# Patient Record
Sex: Female | Born: 1945 | Race: White | Hispanic: No | Marital: Single | Smoking: Never smoker
Health system: Southern US, Community
[De-identification: ages and names within clinical notes are randomized; demographics above are authoritative.]

## PROBLEM LIST (undated history)

## (undated) DIAGNOSIS — E079 Disorder of thyroid, unspecified: Secondary | ICD-10-CM

## (undated) DIAGNOSIS — M549 Dorsalgia, unspecified: Secondary | ICD-10-CM

## (undated) DIAGNOSIS — M419 Scoliosis, unspecified: Secondary | ICD-10-CM

## (undated) DIAGNOSIS — Z9109 Other allergy status, other than to drugs and biological substances: Secondary | ICD-10-CM

## (undated) DIAGNOSIS — J40 Bronchitis, not specified as acute or chronic: Secondary | ICD-10-CM

## (undated) DIAGNOSIS — K219 Gastro-esophageal reflux disease without esophagitis: Secondary | ICD-10-CM

## (undated) DIAGNOSIS — F329 Major depressive disorder, single episode, unspecified: Secondary | ICD-10-CM

## (undated) DIAGNOSIS — F32A Depression, unspecified: Secondary | ICD-10-CM

## (undated) HISTORY — DX: Bronchitis, not specified as acute or chronic: J40

## (undated) HISTORY — PX: KNEE SURGERY: SHX244

## (undated) HISTORY — DX: Depression, unspecified: F32.A

## (undated) HISTORY — DX: Scoliosis, unspecified: M41.9

## (undated) HISTORY — DX: Disorder of thyroid, unspecified: E07.9

## (undated) HISTORY — DX: Dorsalgia, unspecified: M54.9

## (undated) HISTORY — DX: Gastro-esophageal reflux disease without esophagitis: K21.9

## (undated) HISTORY — DX: Major depressive disorder, single episode, unspecified: F32.9

## (undated) HISTORY — PX: CATARACT EXTRACTION, BILATERAL: SHX1313

## (undated) HISTORY — DX: Other allergy status, other than to drugs and biological substances: Z91.09

---

## 1970-10-19 HISTORY — PX: APPENDECTOMY: SHX54

## 1996-10-19 HISTORY — PX: TONSILLECTOMY: SUR1361

## 2004-09-01 ENCOUNTER — Ambulatory Visit: Payer: Self-pay | Admitting: Gastroenterology

## 2005-12-29 ENCOUNTER — Ambulatory Visit: Payer: Self-pay | Admitting: Gastroenterology

## 2010-10-08 ENCOUNTER — Emergency Department (HOSPITAL_BASED_OUTPATIENT_CLINIC_OR_DEPARTMENT_OTHER)
Admission: EM | Admit: 2010-10-08 | Discharge: 2010-10-08 | Payer: Self-pay | Source: Home / Self Care | Admitting: Emergency Medicine

## 2010-10-21 ENCOUNTER — Ambulatory Visit
Admission: RE | Admit: 2010-10-21 | Discharge: 2010-10-21 | Payer: Self-pay | Source: Home / Self Care | Attending: Family Medicine | Admitting: Family Medicine

## 2010-10-21 DIAGNOSIS — E039 Hypothyroidism, unspecified: Secondary | ICD-10-CM | POA: Insufficient documentation

## 2010-10-21 DIAGNOSIS — M79606 Pain in leg, unspecified: Secondary | ICD-10-CM | POA: Insufficient documentation

## 2010-10-21 DIAGNOSIS — M545 Low back pain, unspecified: Secondary | ICD-10-CM | POA: Insufficient documentation

## 2010-10-21 DIAGNOSIS — M25519 Pain in unspecified shoulder: Secondary | ICD-10-CM | POA: Insufficient documentation

## 2010-10-21 DIAGNOSIS — M25569 Pain in unspecified knee: Secondary | ICD-10-CM | POA: Insufficient documentation

## 2010-10-22 ENCOUNTER — Ambulatory Visit: Admit: 2010-10-22 | Payer: Self-pay | Admitting: Family Medicine

## 2010-10-28 ENCOUNTER — Encounter
Admission: RE | Admit: 2010-10-28 | Discharge: 2010-11-17 | Payer: Self-pay | Source: Home / Self Care | Attending: Family Medicine | Admitting: Family Medicine

## 2010-11-14 ENCOUNTER — Ambulatory Visit
Admission: RE | Admit: 2010-11-14 | Discharge: 2010-11-14 | Payer: Self-pay | Source: Home / Self Care | Attending: Family Medicine | Admitting: Family Medicine

## 2010-11-14 ENCOUNTER — Encounter: Payer: Self-pay | Admitting: Family Medicine

## 2010-11-20 NOTE — Op Note (Signed)
Summary: Office Procedure Consent form  Office Procedure Consent form   Imported By: Darius Bump 11/14/2010 14:17:47  _____________________________________________________________________  External Attachment:    Type:   Image     Comment:   External Document
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
## 2010-12-11 ENCOUNTER — Ambulatory Visit: Payer: Worker's Compensation | Attending: Family Medicine | Admitting: Physical Therapy

## 2010-12-11 DIAGNOSIS — M25619 Stiffness of unspecified shoulder, not elsewhere classified: Secondary | ICD-10-CM | POA: Insufficient documentation

## 2010-12-11 DIAGNOSIS — IMO0001 Reserved for inherently not codable concepts without codable children: Secondary | ICD-10-CM | POA: Insufficient documentation

## 2010-12-11 DIAGNOSIS — M25519 Pain in unspecified shoulder: Secondary | ICD-10-CM | POA: Insufficient documentation

## 2010-12-17 ENCOUNTER — Ambulatory Visit: Payer: Worker's Compensation

## 2010-12-18 ENCOUNTER — Ambulatory Visit: Payer: Worker's Compensation | Attending: Family Medicine | Admitting: Rehabilitation

## 2010-12-18 DIAGNOSIS — M25519 Pain in unspecified shoulder: Secondary | ICD-10-CM | POA: Insufficient documentation

## 2010-12-18 DIAGNOSIS — M25619 Stiffness of unspecified shoulder, not elsewhere classified: Secondary | ICD-10-CM | POA: Insufficient documentation

## 2010-12-18 DIAGNOSIS — IMO0001 Reserved for inherently not codable concepts without codable children: Secondary | ICD-10-CM | POA: Insufficient documentation

## 2010-12-20 ENCOUNTER — Encounter: Payer: Self-pay | Admitting: *Deleted

## 2010-12-23 ENCOUNTER — Encounter: Payer: Worker's Compensation | Admitting: Rehabilitation

## 2010-12-25 ENCOUNTER — Ambulatory Visit: Payer: Worker's Compensation | Admitting: Rehabilitation

## 2010-12-29 ENCOUNTER — Ambulatory Visit (INDEPENDENT_AMBULATORY_CARE_PROVIDER_SITE_OTHER): Payer: Worker's Compensation | Admitting: Family Medicine

## 2010-12-29 ENCOUNTER — Encounter: Payer: Self-pay | Admitting: Family Medicine

## 2010-12-29 DIAGNOSIS — M25519 Pain in unspecified shoulder: Secondary | ICD-10-CM
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
## 2011-01-07 ENCOUNTER — Ambulatory Visit: Payer: Worker's Compensation | Admitting: Physical Therapy

## 2011-01-12 ENCOUNTER — Ambulatory Visit: Payer: Worker's Compensation

## 2012-05-19 LAB — HM MAMMOGRAPHY

## 2012-07-26 ENCOUNTER — Encounter: Payer: Self-pay | Admitting: Family Medicine

## 2012-07-26 ENCOUNTER — Ambulatory Visit (INDEPENDENT_AMBULATORY_CARE_PROVIDER_SITE_OTHER): Payer: Worker's Compensation | Admitting: Family Medicine

## 2012-07-26 VITALS — BP 122/80 | HR 66 | Temp 97.7°F | Ht 67.0 in | Wt 188.0 lb

## 2012-07-26 DIAGNOSIS — E039 Hypothyroidism, unspecified: Secondary | ICD-10-CM

## 2012-07-26 DIAGNOSIS — Z23 Encounter for immunization: Secondary | ICD-10-CM

## 2012-07-26 DIAGNOSIS — Z0000 Encounter for general adult medical examination without abnormal findings: Secondary | ICD-10-CM

## 2012-07-26 DIAGNOSIS — R5381 Other malaise: Secondary | ICD-10-CM

## 2012-07-26 DIAGNOSIS — R5383 Other fatigue: Secondary | ICD-10-CM

## 2012-07-26 DIAGNOSIS — E669 Obesity, unspecified: Secondary | ICD-10-CM

## 2012-07-26 DIAGNOSIS — N390 Urinary tract infection, site not specified: Secondary | ICD-10-CM

## 2012-07-26 LAB — HEPATIC FUNCTION PANEL
ALT: 15 U/L (ref 0–35)
AST: 22 U/L (ref 0–37)
Bilirubin, Direct: 0.1 mg/dL (ref 0.0–0.3)
Total Bilirubin: 0.9 mg/dL (ref 0.3–1.2)
Total Protein: 7.3 g/dL (ref 6.0–8.3)

## 2012-07-26 LAB — CBC WITH DIFFERENTIAL/PLATELET
Basophils Absolute: 0 10*3/uL (ref 0.0–0.1)
Basophils Relative: 0.2 % (ref 0.0–3.0)
Eosinophils Absolute: 0 10*3/uL (ref 0.0–0.7)
HCT: 39.3 % (ref 36.0–46.0)
Hemoglobin: 13.1 g/dL (ref 12.0–15.0)
Lymphocytes Relative: 28.4 % (ref 12.0–46.0)
Lymphs Abs: 1.5 10*3/uL (ref 0.7–4.0)
MCHC: 33.3 g/dL (ref 30.0–36.0)
MCV: 95 fl (ref 78.0–100.0)
Neutro Abs: 3.4 10*3/uL (ref 1.4–7.7)
RBC: 4.14 Mil/uL (ref 3.87–5.11)
RDW: 12.1 % (ref 11.5–14.6)

## 2012-07-26 LAB — BASIC METABOLIC PANEL
BUN: 19 mg/dL (ref 6–23)
CO2: 28 mEq/L (ref 19–32)
Calcium: 9.3 mg/dL (ref 8.4–10.5)
Chloride: 101 mEq/L (ref 96–112)
Creatinine, Ser: 0.6 mg/dL (ref 0.4–1.2)
Glucose, Bld: 72 mg/dL (ref 70–99)

## 2012-07-26 LAB — T3, FREE: T3, Free: 2.5 pg/mL (ref 2.3–4.2)

## 2012-07-26 MED ORDER — VENLAFAXINE HCL ER 75 MG PO CP24: ORAL_CAPSULE | ORAL | Status: DC

## 2012-07-26 NOTE — Patient Instructions (Addendum)
Hypothyroidism The thyroid is a large gland located in the lower front of your neck. The thyroid gland helps control metabolism. Metabolism is how your body handles food. It controls metabolism with the hormone thyroxine. When this gland is underactive (hypothyroid), it produces too little hormone.  CAUSES These include:   Absence or destruction of thyroid tissue.  Goiter due to iodine deficiency.  Goiter due to medications.  Congenital defects (since birth).  Problems with the pituitary. This causes a lack of TSH (thyroid stimulating hormone). This hormone tells the thyroid to turn out more hormone. SYMPTOMS  Lethargy (feeling as though you have no energy)  Cold intolerance  Weight gain (in spite of normal food intake)  Dry skin  Coarse hair  Menstrual irregularity (if severe, may lead to infertility)  Slowing of thought processes Cardiac problems are also caused by insufficient amounts of thyroid hormone. Hypothyroidism in the newborn is cretinism, and is an extreme form. It is important that this form be treated adequately and immediately or it will lead rapidly to retarded physical and mental development. DIAGNOSIS  To prove hypothyroidism, your caregiver may do blood tests and ultrasound tests. Sometimes the signs are hidden. It may be necessary for your caregiver to watch this illness with blood tests either before or after diagnosis and treatment. TREATMENT  Low levels of thyroid hormone are increased by using synthetic thyroid hormone. This is a safe, effective treatment. It usually takes about four weeks to gain the full effects of the medication. After you have the full effect of the medication, it will generally take another four weeks for problems to leave. Your caregiver may start you on low doses. If you have had heart problems the dose may be gradually increased. It is generally not an emergency to get rapidly to normal. HOME CARE INSTRUCTIONS   Take your  medications as your caregiver suggests. Let your caregiver know of any medications you are taking or start taking. Your caregiver will help you with dosage schedules.  As your condition improves, your dosage needs may increase. It will be necessary to have continuing blood tests as suggested by your caregiver.  Report all suspected medication side effects to your caregiver. SEEK MEDICAL CARE IF: Seek medical care if you develop:  Sweating.  Tremulousness (tremors).  Anxiety.  Rapid weight loss.  Heat intolerance.  Emotional swings.  Diarrhea.  Weakness. SEEK IMMEDIATE MEDICAL CARE IF:  You develop chest pain, an irregular heart beat (palpitations), or a rapid heart beat. MAKE SURE YOU:   Understand these instructions.  Will watch your condition.  Will get help right away if you are not doing well or get worse. Document Released: 10/05/2005 Document Revised: 12/28/2011 Document Reviewed: 05/25/2008 [facility] Patient Information 2013 [place], [place]. Calorie Counting Diet A calorie counting diet requires you to eat the number of calories that are right for you in a day. Calories are the measurement of how much energy you get from the food you eat. Eating the right amount of calories is important for staying at a healthy weight. If you eat too many calories, your body will store them as fat and you may gain weight. If you eat too few calories, you may lose weight. Counting the number of calories you eat during a day will help you know if you are eating the right amount. A Registered Dietitian can determine how many calories you need in a day. The amount of calories needed varies from person to person. If your goal is to  lose weight, you will need to eat fewer calories. Losing weight can benefit you if you are overweight or have health problems such as heart disease, high blood pressure, or diabetes. If your goal is to gain weight, you will need to eat more calories. Gaining weight may  be necessary if you have a certain health problem that causes your body to need more energy. TIPS Whether you are increasing or decreasing the number of calories you eat during a day, it may be hard to get used to changes in what you eat and drink. The following are tips to help you keep track of the number of calories you eat.  Measure foods at home with measuring cups. This helps you know the amount of food and number of calories you are eating.  Restaurants often serve food in amounts that are larger than 1 serving. While eating out, estimate how many servings of a food you are given. For example, a serving of cooked rice is  cup or about the size of half of a fist. Knowing serving sizes will help you be aware of how much food you are eating at restaurants.  Ask for smaller portion sizes or child-size portions at restaurants.  Plan to eat half of a meal at a restaurant. Take the rest home or share the other half with a friend.  Read the Nutrition Facts panel on food labels for calorie content and serving size. You can find out how many servings are in a package, the size of a serving, and the number of calories each serving has.  For example, a package might contain 3 cookies. The Nutrition Facts panel on that package says that 1 serving is 1 cookie. Below that, it will say there are 3 servings in the container. The calories section of the Nutrition Facts label says there are 90 calories. This means there are 90 calories in 1 cookie (1 serving). If you eat 1 cookie you have eaten 90 calories. If you eat all 3 cookies, you have eaten 270 calories (3 servings x 90 calories = 270 calories). The list below tells you how big or small some common portion sizes are.  1 oz.........4 stacked dice.  3 oz........Marland KitchenDeck of cards.  1 tsp.......Marland KitchenTip of little finger.  1 tbs......Marland KitchenMarland KitchenThumb.  2 tbs.......Marland KitchenGolf ball.   cup......Marland KitchenHalf of a fist.  1 cup.......Marland KitchenA fist. KEEP A FOOD LOG Write down every  food item you eat, the amount you eat, and the number of calories in each food you eat during the day. At the end of the day, you can add up the total number of calories you have eaten. It may help to keep a list like the one below. Find out the calorie information by reading the Nutrition Facts panel on food labels. Breakfast  Bran cereal (1 cup, 110 calories).  Fat-free milk ( cup, 45 calories). Snack  Apple (1 medium, 80 calories). Lunch  Spinach (1 cup, 20 calories).  Tomato ( medium, 20 calories).  Chicken breast strips (3 oz, 165 calories).  Shredded cheddar cheese ( cup, 110 calories).  Light [place] dressing (2 tbs, 60 calories).  Whole-wheat bread (1 slice, 80 calories).  Tub margarine (1 tsp, 35 calories).  Vegetable soup (1 cup, 160 calories). Dinner  Pork chop (3 oz, 190 calories).  Brown rice (1 cup, 215 calories).  Steamed broccoli ( cup, 20 calories).  Strawberries (1  cup, 65 calories).  Whipped cream (1 tbs, 50 calories). Daily Calorie Total: 1425 Document Released:  10/05/2005 Document Revised: 12/28/2011 Document Reviewed: 04/01/2007 [facility] Patient Information 2013 [place], [place].

## 2012-07-27 ENCOUNTER — Encounter: Payer: Self-pay | Admitting: Family Medicine

## 2012-07-27 DIAGNOSIS — E669 Obesity, unspecified: Secondary | ICD-10-CM | POA: Insufficient documentation

## 2012-07-27 LAB — POCT URINALYSIS DIPSTICK
Blood, UA: NEGATIVE
Glucose, UA: NEGATIVE
Spec Grav, UA: <=1.005
Urobilinogen, UA: 0.2
pH, UA: 7

## 2012-07-27 LAB — VARICELLA ZOSTER ANTIBODY, IGG: Varicella IgG: 3.62 {ISR} — ABNORMAL HIGH (ref <0.90)

## 2012-07-27 NOTE — Assessment & Plan Note (Signed)
D/w diet and exercise Inc exercise

## 2012-07-27 NOTE — Assessment & Plan Note (Signed)
Check labs Will refill meds if tsh normal

## 2012-07-27 NOTE — Progress Notes (Signed)
  Subjective:    Patient ID: Denise Lowery, female    DOB: 1946/10/09, 66 y.o.   MRN: [id number]  HPI Pt is here to establish and get refill on meds.  She also wants to discuss weight loss.  No other complaints.   Review of Systems As abive    Objective:       Physical Exam  Nursing note and vitals reviewed. Constitutional: She is oriented to person, place, and time. She appears well-developed and well-nourished.  Neck: Normal range of motion. Neck supple. No thyromegaly present.  Cardiovascular: Normal rate and normal heart sounds.   No murmur heard. Pulmonary/Chest: Effort normal and breath sounds normal. No respiratory distress. She has no wheezes. She has no rales. She exhibits no tenderness.  Abdominal: Soft. She exhibits no distension and no mass. There is no tenderness. There is no rebound and no guarding.  Musculoskeletal: Normal range of motion. She exhibits no edema and no tenderness.  Lymphadenopathy:    She has no cervical adenopathy.  Neurological: She is alert and oriented to person, place, and time.  Skin: No rash noted. No erythema.  Psychiatric: She has a normal mood and affect. Her behavior is normal. Judgment and thought content normal.   Filed Vitals:   07/26/12 1408  BP: 122/80  Pulse: 66  Temp: 97.7 F (36.5 C)  TempSrc: Oral  Height: 5\' 7"  (1.702 m)  Weight: 188 lb (85.276 kg)  SpO2: 96%          Assessment & Plan:

## 2012-08-05 ENCOUNTER — Other Ambulatory Visit: Payer: Self-pay

## 2012-08-05 MED ORDER — LEVOTHYROXINE SODIUM 75 MCG PO TABS: 75.0000 ug | ORAL_TABLET | Freq: Every day | ORAL | Status: DC

## 2012-08-05 NOTE — Telephone Encounter (Signed)
Rx sent pt aware.   MW  

## 2012-10-25 ENCOUNTER — Other Ambulatory Visit: Payer: Self-pay | Admitting: Family Medicine

## 2012-11-21 ENCOUNTER — Ambulatory Visit (INDEPENDENT_AMBULATORY_CARE_PROVIDER_SITE_OTHER): Payer: BC Managed Care – PPO | Admitting: Family Medicine

## 2012-11-21 ENCOUNTER — Encounter: Payer: Self-pay | Admitting: Family Medicine

## 2012-11-21 VITALS — BP 120/74 | HR 91 | Temp 98.2°F | Wt 192.0 lb

## 2012-11-21 DIAGNOSIS — M25559 Pain in unspecified hip: Secondary | ICD-10-CM

## 2012-11-21 MED ORDER — CYCLOBENZAPRINE HCL 5 MG PO TABS: 5.0000 mg | ORAL_TABLET | Freq: Three times a day (TID) | ORAL | Status: DC | PRN

## 2012-11-21 MED ORDER — MELOXICAM 15 MG PO TABS: ORAL_TABLET | ORAL | Status: DC

## 2012-11-21 NOTE — Patient Instructions (Signed)
Hip Pain  The hips join the upper legs to the lower pelvis. The bones, cartilage, tendons, and muscles of the hip joint perform a lot of work each day holding your body weight and allowing you to move around.  Hip pain is a common symptom. It can range from a minor ache to severe pain on 1 or both hips. Pain may be felt on the inside of the hip joint near the groin, or the outside near the buttocks and upper thigh. There may be swelling or stiffness as well. It occurs more often when a person walks or performs activity. There are many reasons hip pain can develop.  CAUSES   It is important to work with your caregiver to identify the cause since many conditions can impact the bones, cartilage, muscles, and tendons of the hips. Causes for hip pain include:   Broken (fractured) bones.   Separation of the thighbone from the hip socket (dislocation).   Torn cartilage of the hip joint.   Swelling (inflammation) of a tendon (tendonitis), the sac within the hip joint (bursitis), or a joint.   A weakening in the abdominal wall (hernia), affecting the nerves to the hip.   Arthritis in the hip joint or lining of the hip joint.   Pinched nerves in the back, hip, or upper thigh.   A bulging disc in the spine (herniated disc).   Rarely, bone infection or cancer.  DIAGNOSIS   The location of your hip pain will help your caregiver understand what may be causing the pain. A diagnosis is based on your medical history, your symptoms, results from your physical exam, and results from diagnostic tests. Diagnostic tests may include X-ray exams, a computerized magnetic scan (magnetic resonance imaging, MRI), or bone scan.  TREATMENT   Treatment will depend on the cause of your hip pain. Treatment may include:   Limiting activities and resting until symptoms improve.   Crutches or other walking supports (a cane or brace).   Ice, elevation, and compression.   Physical therapy or home exercises.    Shoe inserts or special shoes.   Losing weight.   Medications to reduce pain.   Undergoing surgery.  HOME CARE INSTRUCTIONS    Only take over-the-counter or prescription medicines for pain, discomfort, or fever as directed by your caregiver.   Put ice on the injured area:   Put ice in a plastic bag.   Place a towel between your skin and the bag.   Leave the ice on for 15 to 20 minutes at a time, 3 to 4 times a day.   Keep your leg raised (elevated) when possible to lessen swelling.   Avoid activities that cause pain.   Follow specific exercises as directed by your caregiver.   Sleep with a pillow between your legs on your most comfortable side.   Record how often you have hip pain, the location of the pain, and what it feels like. This information may be helpful to you and your caregiver.   Ask your caregiver about returning to work or sports and whether you should drive.   Follow up with your caregiver for further exams, therapy, or testing as directed.  SEEK MEDICAL CARE IF:    Your pain or swelling continues or worsens after 1 week.   You are feeling unwell or have chills.   You have increasing difficulty with walking.   You have a loss of sensation or other new symptoms.   You have questions   or concerns.  SEEK IMMEDIATE MEDICAL CARE IF:    You cannot put weight on the affected hip.   You have fallen.   You have a sudden increase in pain and swelling in your hip.   You have a fever.  MAKE SURE YOU:    Understand these instructions.   Will watch your condition.   Will get help right away if you are not doing well or get worse.  Document Released: 03/25/2010 Document Revised: 12/28/2011 Document Reviewed: 03/25/2010  [employer] Patient Information 2013 [employer].

## 2012-11-21 NOTE — Progress Notes (Signed)
  Subjective:    Denise Lowery is a 66 y.o. female who presents with left hip pain. Onset of the symptoms was several days ago. Inciting event: none. The patient reports the hip pain is worse with weight bearing. Aggravating symptoms include: any weight bearing. Patient has had no prior hip problems. Previous visits for this problem: none. Evaluation to date: none. Treatment to date: OTC analgesics, which have been not very effective.  The following portions of the patient's history were reviewed and updated as appropriate: allergies, current medications, past family history, past medical history, past social history, past surgical history and problem list.   Review of Systems Pertinent items are noted in HPI.   Objective:    BP 120/74  Pulse 91  Temp 98.2 F (36.8 C) (Oral)  Wt 192 lb (87.091 kg)  SpO2 95% Right hip: normal  Left hip: positives: pain with movement of hip and negatives: FROM no pain with heel impact   Imaging: X-ray the left hip: not available    Assessment:    Hip pain --suspect low back etiology   Plan:    Educational materials distributed. Home exercises discussed. NSAIDs per medication orders. X-rays per orders.

## 2012-11-22 ENCOUNTER — Ambulatory Visit (HOSPITAL_BASED_OUTPATIENT_CLINIC_OR_DEPARTMENT_OTHER)
Admission: RE | Admit: 2012-11-22 | Discharge: 2012-11-22 | Disposition: A | Discharge status: Home / Self Care | Payer: BC Managed Care – PPO | Source: Ambulatory Visit | Attending: Family Medicine | Admitting: Family Medicine

## 2012-11-22 DIAGNOSIS — M545 Low back pain, unspecified: Secondary | ICD-10-CM | POA: Insufficient documentation

## 2012-11-22 DIAGNOSIS — M4716 Other spondylosis with myelopathy, lumbar region: Secondary | ICD-10-CM | POA: Insufficient documentation

## 2012-11-22 DIAGNOSIS — M25559 Pain in unspecified hip: Secondary | ICD-10-CM | POA: Insufficient documentation

## 2012-11-23 ENCOUNTER — Encounter: Payer: Self-pay | Admitting: Family Medicine

## 2012-12-03 ENCOUNTER — Other Ambulatory Visit: Payer: Self-pay

## 2012-12-18 ENCOUNTER — Other Ambulatory Visit: Payer: Self-pay | Admitting: Family Medicine

## 2012-12-19 NOTE — Telephone Encounter (Signed)
Last seen and filled 11/21/12 # 30. Please advise      KP

## 2013-01-15 ENCOUNTER — Other Ambulatory Visit: Payer: Self-pay | Admitting: Family Medicine

## 2013-01-17 NOTE — Telephone Encounter (Signed)
Last seen 11/21/12 and filled 12/18/12 #30. Please advise     KP

## 2013-02-13 ENCOUNTER — Other Ambulatory Visit: Payer: Self-pay | Admitting: Family Medicine

## 2013-02-13 NOTE — Telephone Encounter (Signed)
Last seen and filled 11/21/12 #30 with no refills. Please advise     KP

## 2013-04-17 ENCOUNTER — Other Ambulatory Visit: Payer: Self-pay | Admitting: Family Medicine

## 2013-05-24 ENCOUNTER — Other Ambulatory Visit: Payer: Self-pay

## 2013-07-13 ENCOUNTER — Other Ambulatory Visit: Payer: Self-pay | Admitting: Family Medicine

## 2013-08-12 ENCOUNTER — Other Ambulatory Visit: Payer: Self-pay | Admitting: Family Medicine

## 2013-08-24 ENCOUNTER — Other Ambulatory Visit: Payer: Self-pay

## 2013-09-12 ENCOUNTER — Other Ambulatory Visit: Payer: Self-pay | Admitting: Family Medicine

## 2013-10-13 ENCOUNTER — Other Ambulatory Visit: Payer: Self-pay | Admitting: Family Medicine

## 2013-11-12 ENCOUNTER — Other Ambulatory Visit: Payer: Self-pay | Admitting: Family Medicine

## 2013-11-28 ENCOUNTER — Other Ambulatory Visit: Payer: Self-pay | Admitting: Family Medicine

## 2013-12-01 ENCOUNTER — Other Ambulatory Visit: Payer: Self-pay | Admitting: Family Medicine

## 2013-12-21 ENCOUNTER — Telehealth: Payer: Self-pay

## 2013-12-21 NOTE — Telephone Encounter (Signed)
Left message for call back Non-identifiable   Pap- CCS- MMG- Flu- Td- 07/26/12 PNA Z

## 2013-12-21 NOTE — Telephone Encounter (Signed)
Medication and allergies:  Reviewed and updated  90 day supply/mail order: n/a Local pharmacy:  [id number] - [place], [place] - [address] AT [place]   Immunizations due:  Influenza-declined, PNA-declined   A/P: No changes to personal, family history or past surgical hx CCS-never had one MMG-05/2012-negative per patient Tdap- 07/26/12 Shingles- has not received, would like a prescription   To Discuss with Provider: Would like to have labs drawn specifically (Vitamin D, Magnesium levels) Weight loss

## 2013-12-25 ENCOUNTER — Encounter: Payer: Self-pay | Admitting: Family Medicine

## 2013-12-25 ENCOUNTER — Ambulatory Visit (INDEPENDENT_AMBULATORY_CARE_PROVIDER_SITE_OTHER): Payer: BC Managed Care – PPO | Admitting: Family Medicine

## 2013-12-25 VITALS — BP 116/70 | HR 76 | Temp 98.3°F | Ht 67.0 in | Wt 192.2 lb

## 2013-12-25 DIAGNOSIS — D229 Melanocytic nevi, unspecified: Secondary | ICD-10-CM

## 2013-12-25 DIAGNOSIS — Z1239 Encounter for other screening for malignant neoplasm of breast: Secondary | ICD-10-CM

## 2013-12-25 DIAGNOSIS — Z23 Encounter for immunization: Secondary | ICD-10-CM

## 2013-12-25 DIAGNOSIS — D239 Other benign neoplasm of skin, unspecified: Secondary | ICD-10-CM

## 2013-12-25 DIAGNOSIS — Z136 Encounter for screening for cardiovascular disorders: Secondary | ICD-10-CM

## 2013-12-25 DIAGNOSIS — Z0000 Encounter for general adult medical examination without abnormal findings: Secondary | ICD-10-CM

## 2013-12-25 DIAGNOSIS — E2839 Other primary ovarian failure: Secondary | ICD-10-CM

## 2013-12-25 DIAGNOSIS — E039 Hypothyroidism, unspecified: Secondary | ICD-10-CM

## 2013-12-25 LAB — CBC WITH DIFFERENTIAL/PLATELET
BASOS PCT: 0.3 % (ref 0.0–3.0)
Basophils Absolute: 0 10*3/uL (ref 0.0–0.1)
EOS PCT: 0.1 % (ref 0.0–5.0)
Eosinophils Absolute: 0 10*3/uL (ref 0.0–0.7)
HCT: 40.3 % (ref 36.0–46.0)
Hemoglobin: 13.4 g/dL (ref 12.0–15.0)
LYMPHS PCT: 19.2 % (ref 12.0–46.0)
Lymphs Abs: 1 10*3/uL (ref 0.7–4.0)
MCHC: 33.3 g/dL (ref 30.0–36.0)
MCV: 95.6 fl (ref 78.0–100.0)
MONOS PCT: 9.5 % (ref 3.0–12.0)
Monocytes Absolute: 0.5 10*3/uL (ref 0.1–1.0)
NEUTROS PCT: 70.9 % (ref 43.0–77.0)
Neutro Abs: 3.5 10*3/uL (ref 1.4–7.7)
PLATELETS: 277 10*3/uL (ref 150.0–400.0)
RBC: 4.21 Mil/uL (ref 3.87–5.11)
RDW: 12.6 % (ref 11.5–14.6)
WBC: 5 10*3/uL (ref 4.5–10.5)

## 2013-12-25 LAB — HEPATIC FUNCTION PANEL
ALK PHOS: 72 U/L (ref 39–117)
ALT: 12 U/L (ref 0–35)
AST: 17 U/L (ref 0–37)
Albumin: 4.4 g/dL (ref 3.5–5.2)
BILIRUBIN DIRECT: 0 mg/dL (ref 0.0–0.3)
BILIRUBIN TOTAL: 0.8 mg/dL (ref 0.3–1.2)
TOTAL PROTEIN: 7.3 g/dL (ref 6.0–8.3)

## 2013-12-25 LAB — BASIC METABOLIC PANEL
BUN: 13 mg/dL (ref 6–23)
CALCIUM: 9.6 mg/dL (ref 8.4–10.5)
CHLORIDE: 107 meq/L (ref 96–112)
CO2: 24 mEq/L (ref 19–32)
CREATININE: 0.6 mg/dL (ref 0.4–1.2)
GFR: 100.38 mL/min (ref >60.00)
Glucose, Bld: 77 mg/dL (ref 70–99)
Potassium: 4.1 mEq/L (ref 3.5–5.1)
Sodium: 140 mEq/L (ref 135–145)

## 2013-12-25 LAB — LIPID PANEL
CHOL/HDL RATIO: 3
Cholesterol: 237 mg/dL — ABNORMAL HIGH (ref 0–200)
HDL: 69.9 mg/dL (ref >39.00)
LDL Cholesterol: 149 mg/dL — ABNORMAL HIGH (ref 0–99)
TRIGLYCERIDES: 93 mg/dL (ref 0.0–149.0)
VLDL: 18.6 mg/dL (ref 0.0–40.0)

## 2013-12-25 MED ORDER — ZOSTER VACCINE LIVE 19400 UNT/0.65ML ~~LOC~~ SOLR: 0.6500 mL | Freq: Once | SUBCUTANEOUS | Status: DC

## 2013-12-25 NOTE — Assessment & Plan Note (Signed)
Check labs con't meds 

## 2013-12-25 NOTE — Progress Notes (Signed)
Pre visit review using our clinic review tool, if applicable. No additional management support is needed unless otherwise documented below in the visit note. 

## 2013-12-25 NOTE — Progress Notes (Signed)
Subjective:     Denise Lowery is a 68 y.o. female and is here for a comprehensive physical exam. The patient reports no problems.  History   Social History  . Marital Status: Divorced    Spouse Name: N/A    Number of Children: N/A  . Years of Education: N/A   Occupational History  . Not on file.   Social History Main Topics  . Smoking status: Never Smoker   . Smokeless tobacco: Never Used  . Alcohol Use: Yes     Comment: Occ  . Drug Use: No  . Sexual Activity: Not on file   Other Topics Concern  . Not on file   Social History Narrative   Exercise-- 3x a week,  Walking, weights, ellipitical, bike          Health Maintenance  Topic Date Due  . Colonoscopy  08/18/1997  . Zostavax  08/19/2007  . Influenza Vaccine  07/23/2014 (Originally 05/19/2013)  . Pneumococcal Polysaccharide Vaccine Age 48 And Over  07/23/2014 (Originally 08/18/2012)  . Mammogram  05/19/2014  . Tetanus/tdap  07/26/2022    The following portions of the patient's history were reviewed and updated as appropriate:  She  has a past medical history of Environmental allergies; Bronchitis; and Thyroid disease. She  does not have any pertinent problems on file. She  has past surgical history that includes Tonsillectomy (1998); Appendectomy (1972); and Knee surgery. Her family history includes Stroke (age of onset: 81) in her father. She  reports that she has never smoked. She has never used smokeless tobacco. She reports that she drinks alcohol. She reports that she does not use illicit drugs. She has a current medication list which includes the following prescription(s): cyclobenzaprine, levothyroxine, multivitamin, and venlafaxine xr. Current Outpatient Prescriptions on File Prior to Visit  Medication Sig Dispense Refill  . cyclobenzaprine (FLEXERIL) 5 MG tablet Take 1 tablet (5 mg total) by mouth 3 (three) times daily as needed.  30 tablet  0  . levothyroxine (SYNTHROID, LEVOTHROID) 75 MCG tablet TAKE 1  TABLET BY MOUTH EVERY DAY  30 tablet  5  . multivitamin (THERAGRAN) per tablet        . venlafaxine XR (EFFEXOR-XR) 75 MG 24 hr capsule TAKE 3 CAPSULES BY MOUTH DAILY. NEEDS APPT  90 capsule  0   No current facility-administered medications on file prior to visit.   She is allergic to penicillins..  Review of Systems Review of Systems  Constitutional: Negative for activity change, appetite change and fatigue.  HENT: Negative for hearing loss, congestion, tinnitus and ear discharge.  dentist q44m Eyes: Negative for visual disturbance (see optho q1y -- vision corrected to 20/20 with glasses).  Respiratory: Negative for cough, chest tightness and shortness of breath.   Cardiovascular: Negative for chest pain, palpitations and leg swelling.  Gastrointestinal: Negative for abdominal pain, diarrhea, constipation and abdominal distention.  Genitourinary: Negative for urgency, frequency, decreased urine volume and difficulty urinating.  Musculoskeletal: Negative for back pain, arthralgias and gait problem.  Skin: Negative for color change, pallor and rash.  Neurological: Negative for dizziness, light-headedness, numbness and headaches.  Hematological: Negative for adenopathy. Does not bruise/bleed easily.  Psychiatric/Behavioral: Negative for suicidal ideas, confusion, sleep disturbance, self-injury, dysphoric mood, decreased concentration and agitation.       Objective:    BP 116/70  Pulse 76  Temp(Src) 98.3 F (36.8 C) (Oral)  Ht 5\' 7"  (1.702 m)  Wt 192 lb 3.2 oz (87.181 kg)  BMI 30.10 kg/m2  SpO2 96% General appearance: alert, cooperative, appears stated age and no distress Head: Normocephalic, without obvious abnormality, atraumatic Eyes: conjunctivae/corneas clear. PERRL, EOM's intact. Fundi benign. Ears: normal TM's and external ear canals both ears Nose: Nares normal. Septum midline. Mucosa normal. No drainage or sinus tenderness. Throat: lips, mucosa, and tongue normal; teeth  and gums normal Neck: no adenopathy, no carotid bruit, no JVD, supple, symmetrical, trachea midline and thyroid not enlarged, symmetric, no tenderness/mass/nodules Back: symmetric, no curvature. ROM normal. No CVA tenderness. Lungs: clear to auscultation bilaterally Breasts: normal appearance, no masses or tenderness Heart: S1, S2 normal Abdomen: soft, non-tender; bowel sounds normal; no masses,  no organomegaly Pelvic: not indicated; post-menopausal, no abnormal Pap smears in past Extremities: extremities normal, atraumatic, no cyanosis or edema Pulses: 2+ and symmetric Skin: Skin color, texture, turgor normal. No rashes or lesions Lymph nodes: Cervical, supraclavicular, and axillary nodes normal. Neurologic: Alert and oriented X 3, normal strength and tone. Normal symmetric reflexes. Normal coordination and gait Psych--no depression, no anxiety      Assessment:    Healthy female exam.      Plan:     ghm utd Check labs See After Visit Summary for Counseling Recommendations

## 2013-12-25 NOTE — Patient Instructions (Signed)

## 2013-12-26 LAB — TSH: TSH: 0.67 u[IU]/mL (ref 0.35–5.50)

## 2013-12-27 ENCOUNTER — Other Ambulatory Visit: Payer: Self-pay | Admitting: Family Medicine

## 2014-01-26 ENCOUNTER — Encounter: Payer: Self-pay | Admitting: Family Medicine

## 2014-02-12 ENCOUNTER — Telehealth: Payer: Self-pay | Admitting: Family Medicine

## 2014-02-12 NOTE — Telephone Encounter (Signed)
Spoke with pt who stated she believes she may have a kidney stone. She had one several years back and se is experiencing the same sx she had then. Appt made with Dr. Birdie Riddle tomorrow at 11:00.

## 2014-02-12 NOTE — Telephone Encounter (Signed)
Caller name: Zeidy Relation to pt: Call back number:[phone] Pharmacy:  Reason for call: pt is having pain in left side.  Pt doesn't know if it's a kidney stone or her ovaries.  pls contact pt.

## 2014-02-13 ENCOUNTER — Ambulatory Visit (INDEPENDENT_AMBULATORY_CARE_PROVIDER_SITE_OTHER): Payer: BC Managed Care – PPO | Admitting: Family Medicine

## 2014-02-13 ENCOUNTER — Encounter: Payer: Self-pay | Admitting: Family Medicine

## 2014-02-13 VITALS — BP 140/90 | HR 73 | Temp 98.2°F | Wt 192.2 lb

## 2014-02-13 DIAGNOSIS — R109 Unspecified abdominal pain: Secondary | ICD-10-CM

## 2014-02-13 LAB — POCT URINALYSIS DIPSTICK
BILIRUBIN UA: NEGATIVE
GLUCOSE UA: NEGATIVE
Ketones, UA: NEGATIVE
Leukocytes, UA: NEGATIVE
Nitrite, UA: NEGATIVE
PH UA: 6
Protein, UA: NEGATIVE
RBC UA: NEGATIVE
UROBILINOGEN UA: 0.2

## 2014-02-13 NOTE — Progress Notes (Signed)
Pre visit review using our clinic review tool, if applicable. No additional management support is needed unless otherwise documented below in the visit note. 

## 2014-02-13 NOTE — Patient Instructions (Signed)
Follow up as needed Start the Miralax once daily- increase to twice daily after 5 days if no bowel movement Drink plenty of fluids REST! If no improvement in abdominal pain, or if pain worsens, please call and we'll proceed with the work up Call with any questions or concerns Hang in there!!

## 2014-02-13 NOTE — Progress Notes (Signed)
   Subjective:    Patient ID: Denise Lowery, female    DOB: 24-Sep-1946, 68 y.o.   MRN: [id number]  HPI L flank pain- pt developed pain 6 days ago, severe at times.  Pain has improved today.  Pain will start in flank and radiate around to back.  Hx of kidney stones.  No dysuria, no hematuria.  No frequency or urgency.  Does heavy lifting intermittently but can't recall an instance recently.  Pt has hx of intermittent discomfort throughout the year.  Last kidney stone episode was 6 yrs ago.  Has not seen urology recently.  S/p lithotripsy.  Pain worsened w/ physical activity.  No pelvic pain.  No nausea, vomiting, pain w/ eating.   Review of Systems For ROS see HPI     Objective:   Physical Exam  Vitals reviewed. Constitutional: She appears well-developed and well-nourished. No distress.  Abdominal: Soft. Bowel sounds are normal. She exhibits distension (mild LUQ). There is no rebound and no guarding.  No CVA tenderness, no suprapubic/pelvic tenderness Mild TTP over LUQ w/ mild distension consistent w/ constipation          Assessment & Plan:

## 2014-02-13 NOTE — Assessment & Plan Note (Signed)
New.  No evidence of kidney stone or UTI on UA or PE.  Nothing in hx or on PE to suggest diverticulitis.  Suspect this is due to constipation.  Start miralax daily.  Reviewed supportive care and red flags that should prompt return.  Pt expressed understanding and is in agreement w/ plan.

## 2014-03-18 ENCOUNTER — Other Ambulatory Visit: Payer: Self-pay | Admitting: Family Medicine

## 2014-03-28 DIAGNOSIS — M25469 Effusion, unspecified knee: Secondary | ICD-10-CM | POA: Insufficient documentation

## 2014-03-28 DIAGNOSIS — R82994 Hypercalciuria: Secondary | ICD-10-CM | POA: Insufficient documentation

## 2014-03-28 DIAGNOSIS — M5137 Other intervertebral disc degeneration, lumbosacral region: Secondary | ICD-10-CM | POA: Insufficient documentation

## 2014-03-28 DIAGNOSIS — G43909 Migraine, unspecified, not intractable, without status migrainosus: Secondary | ICD-10-CM | POA: Insufficient documentation

## 2014-03-28 DIAGNOSIS — M1710 Unilateral primary osteoarthritis, unspecified knee: Secondary | ICD-10-CM | POA: Insufficient documentation

## 2014-03-28 DIAGNOSIS — Q675 Congenital deformity of spine: Secondary | ICD-10-CM | POA: Insufficient documentation

## 2014-04-09 ENCOUNTER — Encounter: Payer: Self-pay | Admitting: Family Medicine

## 2014-04-09 ENCOUNTER — Ambulatory Visit (INDEPENDENT_AMBULATORY_CARE_PROVIDER_SITE_OTHER): Payer: BC Managed Care – PPO | Admitting: Family Medicine

## 2014-04-09 VITALS — BP 124/76 | HR 74 | Temp 98.2°F | Wt 192.0 lb

## 2014-04-09 DIAGNOSIS — E039 Hypothyroidism, unspecified: Secondary | ICD-10-CM

## 2014-04-09 DIAGNOSIS — K2100 Gastro-esophageal reflux disease with esophagitis, without bleeding: Secondary | ICD-10-CM

## 2014-04-09 DIAGNOSIS — E669 Obesity, unspecified: Secondary | ICD-10-CM | POA: Insufficient documentation

## 2014-04-09 DIAGNOSIS — R5381 Other malaise: Secondary | ICD-10-CM

## 2014-04-09 DIAGNOSIS — R5383 Other fatigue: Secondary | ICD-10-CM

## 2014-04-09 LAB — TSH: TSH: 1.31 u[IU]/mL (ref 0.35–4.50)

## 2014-04-09 LAB — BASIC METABOLIC PANEL
BUN: 16 mg/dL (ref 6–23)
CALCIUM: 9.6 mg/dL (ref 8.4–10.5)
CO2: 28 mEq/L (ref 19–32)
Chloride: 101 mEq/L (ref 96–112)
Creatinine, Ser: 0.7 mg/dL (ref 0.4–1.2)
GFR: 96.74 mL/min (ref >60.00)
GLUCOSE: 74 mg/dL (ref 70–99)
Potassium: 4 mEq/L (ref 3.5–5.1)
Sodium: 137 mEq/L (ref 135–145)

## 2014-04-09 LAB — CBC WITH DIFFERENTIAL/PLATELET
BASOS ABS: 0 10*3/uL (ref 0.0–0.1)
Basophils Relative: 0.3 % (ref 0.0–3.0)
Eosinophils Absolute: 0 10*3/uL (ref 0.0–0.7)
Eosinophils Relative: 0 % (ref 0.0–5.0)
HEMATOCRIT: 41.8 % (ref 36.0–46.0)
Hemoglobin: 14.2 g/dL (ref 12.0–15.0)
LYMPHS ABS: 0.6 10*3/uL — AB (ref 0.7–4.0)
LYMPHS PCT: 12.8 % (ref 12.0–46.0)
MCHC: 34 g/dL (ref 30.0–36.0)
MCV: 92.9 fl (ref 78.0–100.0)
Monocytes Absolute: 0.5 10*3/uL (ref 0.1–1.0)
Monocytes Relative: 9 % (ref 3.0–12.0)
Neutro Abs: 3.9 10*3/uL (ref 1.4–7.7)
Neutrophils Relative %: 77.9 % — ABNORMAL HIGH (ref 43.0–77.0)
PLATELETS: 323 10*3/uL (ref 150.0–400.0)
RBC: 4.49 Mil/uL (ref 3.87–5.11)
RDW: 12.8 % (ref 11.5–15.5)
WBC: 5 10*3/uL (ref 4.0–10.5)

## 2014-04-09 LAB — H. PYLORI ANTIBODY, IGG: H Pylori IgG: NEGATIVE

## 2014-04-09 LAB — T3, FREE: T3, Free: 2.6 pg/mL (ref 2.3–4.2)

## 2014-04-09 LAB — VITAMIN B12

## 2014-04-09 LAB — T4, FREE: Free T4: 0.89 ng/dL (ref 0.60–1.60)

## 2014-04-09 MED ORDER — OMEPRAZOLE 40 MG PO CPDR: 40.0000 mg | DELAYED_RELEASE_CAPSULE | Freq: Every day | ORAL | Status: DC

## 2014-04-09 NOTE — Progress Notes (Signed)
   Subjective:    Patient ID: Denise Lowery, female    DOB: [birth year], 68 y.o.   MRN: [id number]  HPI Pt here c/o extreme fatigue and dyspepsia.  She would like her thyroid rechecked.  She has tried otc PPI for stomach and it has helped.   No NV.     Review of Systems As above    Objective:   Physical Exam   BP 124/76  Pulse 74  Temp(Src) 98.2 F (36.8 C) (Oral)  Wt 192 lb (87.091 kg)  SpO2 97% General appearance: alert, cooperative, appears stated age and no distress Throat: lips, mucosa, and tongue normal; teeth and gums normal Neck: no adenopathy, supple, symmetrical, trachea midline and thyroid not enlarged, symmetric, no tenderness/mass/nodules Lungs: clear to auscultation bilaterally Heart: S1, S2 normal Abdomen: soft, non-tender; bowel sounds normal; no masses,  no organomegaly       Assessment & Plan:  1. Unspecified hypothyroidism Check labs - TSH - T3, free - T4, free - Thyroid antibodies  2. Other malaise and fatigue Check labs - Basic metabolic panel - CBC with Differential - Vitamin B12  3. Gastroesophageal reflux disease with esophagitis Start omeprazole and check hpylori - H. pylori antibody, IgG - omeprazole (PRILOSEC) 40 MG capsule; Take 1 capsule (40 mg total) by mouth daily.  Dispense: 30 capsule; Refill: 3

## 2014-04-09 NOTE — Progress Notes (Signed)
Pre visit review using our clinic review tool, if applicable. No additional management support is needed unless otherwise documented below in the visit note. 

## 2014-04-09 NOTE — Patient Instructions (Addendum)
Fatigue Fatigue is a feeling of tiredness, lack of energy, lack of motivation, or feeling tired all the time. Having enough rest, good nutrition, and reducing stress will normally reduce fatigue. Consult your caregiver if it persists. The nature of your fatigue will help your caregiver to find out its cause. The treatment is based on the cause.  CAUSES  There are many causes for fatigue. Most of the time, fatigue can be traced to one or more of your habits or routines. Most causes fit into one or more of three general areas. They are: Lifestyle problems  Sleep disturbances.  Overwork.  Physical exertion.  Unhealthy habits.  Poor eating habits or eating disorders.  Alcohol and/or drug use .  Lack of proper nutrition (malnutrition). Psychological problems  Stress and/or anxiety problems.  Depression.  Grief.  Boredom. Medical Problems or Conditions  Anemia.  Pregnancy.  Thyroid gland problems.  Recovery from major surgery.  Continuous pain.  Emphysema or asthma that is not well controlled  Allergic conditions.  Diabetes.  Infections (such as mononucleosis).  Obesity.  Sleep disorders, such as sleep apnea.  Heart failure or other heart-related problems.  Cancer.  Kidney disease.  Liver disease.  Effects of certain medicines such as antihistamines, cough and cold remedies, prescription pain medicines, heart and blood pressure medicines, drugs used for treatment of cancer, and some antidepressants. SYMPTOMS  The symptoms of fatigue include:   Lack of energy.  Lack of drive (motivation).  Drowsiness.  Feeling of indifference to the surroundings. DIAGNOSIS  The details of how you feel help guide your caregiver in finding out what is causing the fatigue. You will be asked about your present and past health condition. It is important to review all medicines that you take, including prescription and non-prescription items. A thorough exam will be done.  You will be questioned about your feelings, habits, and normal lifestyle. Your caregiver may suggest blood tests, urine tests, or other tests to look for common medical causes of fatigue.  TREATMENT  Fatigue is treated by correcting the underlying cause. For example, if you have continuous pain or depression, treating these causes will improve how you feel. Similarly, adjusting the dose of certain medicines will help in reducing fatigue.  HOME CARE INSTRUCTIONS   Try to get the required amount of good sleep every night.  Eat a healthy and nutritious diet, and drink enough water throughout the day.  Practice ways of relaxing (including yoga or meditation).  Exercise regularly.  Make plans to change situations that cause stress. Act on those plans so that stresses decrease over time. Keep your work and personal routine reasonable.  Avoid street drugs and minimize use of alcohol.  Start taking a daily multivitamin after consulting your caregiver. SEEK MEDICAL CARE IF:   You have persistent tiredness, which cannot be accounted for.  You have fever.  You have unintentional weight loss.  You have headaches.  You have disturbed sleep throughout the night.  You are feeling sad.  You have constipation.  You have dry skin.  You have gained weight.  You are taking any new or different medicines that you suspect are causing fatigue.  You are unable to sleep at night.  You develop any unusual swelling of your legs or other parts of your body. SEEK IMMEDIATE MEDICAL CARE IF:   You are feeling confused.  Your vision is blurred.  You feel faint or pass out.  You develop severe headache.  You develop severe abdominal, pelvic, or   back pain.  You develop chest pain, shortness of breath, or an irregular or fast heartbeat.  You are unable to pass a normal amount of urine.  You develop abnormal bleeding such as bleeding from the rectum or you vomit blood.  You have thoughts  about harming yourself or committing suicide.  You are worried that you might harm someone else. MAKE SURE YOU:   Understand these instructions.  Will watch your condition.  Will get help right away if you are not doing well or get worse. Document Released: 08/02/2007 Document Revised: 12/28/2011 Document Reviewed: 08/02/2007 [facility] Patient Information 2015 [place], [place]. This information is not intended to replace advice given to you by your health care provider. Make sure you discuss any questions you have with your health care provider.   Gastroesophageal Reflux Disease, Adult Gastroesophageal reflux disease (GERD) happens when acid from your stomach flows up into the esophagus. When acid comes in contact with the esophagus, the acid causes soreness (inflammation) in the esophagus. Over time, GERD may create small holes (ulcers) in the lining of the esophagus. CAUSES   Increased body weight. This puts pressure on the stomach, making acid rise from the stomach into the esophagus.  Smoking. This increases acid production in the stomach.  Drinking alcohol. This causes decreased pressure in the lower esophageal sphincter (valve or ring of muscle between the esophagus and stomach), allowing acid from the stomach into the esophagus.  Late evening meals and a full stomach. This increases pressure and acid production in the stomach.  A malformed lower esophageal sphincter. Sometimes, no cause is found. SYMPTOMS   Burning pain in the lower part of the mid-chest behind the breastbone and in the mid-stomach area. This may occur twice a week or more often.  Trouble swallowing.  Sore throat.  Dry cough.  Asthma-like symptoms including chest tightness, shortness of breath, or wheezing. DIAGNOSIS  Your caregiver may be able to diagnose GERD based on your symptoms. In some cases, X-rays and other tests may be done to check for complications or to check the condition of your stomach and  esophagus. TREATMENT  Your caregiver may recommend over-the-counter or prescription medicines to help decrease acid production. Ask your caregiver before starting or adding any new medicines.  HOME CARE INSTRUCTIONS   Change the factors that you can control. Ask your caregiver for guidance concerning weight loss, quitting smoking, and alcohol consumption.  Avoid foods and drinks that make your symptoms worse, such as:  Caffeine or alcoholic drinks.  Chocolate.  Peppermint or mint flavorings.  Garlic and onions.  Spicy foods.  Citrus fruits, such as oranges, lemons, or limes.  Tomato-based foods such as sauce, chili, salsa, and pizza.  Fried and fatty foods.  Avoid lying down for the 3 hours prior to your bedtime or prior to taking a nap.  Eat small, frequent meals instead of large meals.  Wear loose-fitting clothing. Do not wear anything tight around your waist that causes pressure on your stomach.  Raise the head of your bed 6 to 8 inches with wood blocks to help you sleep. Extra pillows will not help.  Only take over-the-counter or prescription medicines for pain, discomfort, or fever as directed by your caregiver.  Do not take aspirin, ibuprofen, or other nonsteroidal anti-inflammatory drugs (NSAIDs). SEEK IMMEDIATE MEDICAL CARE IF:   You have pain in your arms, neck, jaw, teeth, or back.  Your pain increases or changes in intensity or duration.  You develop nausea, vomiting, or sweating (  diaphoresis).  You develop shortness of breath, or you faint.  Your vomit is green, yellow, black, or looks like coffee grounds or blood.  Your stool is red, bloody, or black. These symptoms could be signs of other problems, such as heart disease, gastric bleeding, or esophageal bleeding. MAKE SURE YOU:   Understand these instructions.  Will watch your condition.  Will get help right away if you are not doing well or get worse. Document Released: 07/15/2005 Document  Revised: 12/28/2011 Document Reviewed: 04/24/2011 [facility] Patient Information 2015 [place], [place]. This information is not intended to replace advice given to you by your health care provider. Make sure you discuss any questions you have with your health care provider.

## 2014-04-10 LAB — THYROID ANTIBODIES
Thyroglobulin Ab: <20 IU/mL (ref <40.0)
Thyroperoxidase Ab SerPl-aCnc: <10 IU/mL (ref <35.0)

## 2014-04-17 ENCOUNTER — Telehealth: Payer: Self-pay | Admitting: Family Medicine

## 2014-04-17 NOTE — Telephone Encounter (Signed)
Caller name:Earlyn  Call back number:[phone]   Reason for call:  Pt would like CMA to call her.  No other information given.

## 2014-04-17 NOTE — Telephone Encounter (Signed)
Spoke with patient and she is was in "[place]" said she would call me back when she gets out if the grocery store.    KP

## 2014-04-17 NOTE — Telephone Encounter (Signed)
Pt called back, return call

## 2014-04-18 NOTE — Telephone Encounter (Signed)
Spoke with patient and she is aware that her labs were normal, she decreased her dose of Effexor but she still does not have the energy. She would like to know if there were any other test that we could do. Please advise    KP

## 2014-04-18 NOTE — Telephone Encounter (Signed)
Patient has been made aware and agreed to do so, she will call if no improvement.     KP

## 2014-04-18 NOTE — Telephone Encounter (Signed)
effexor normally helps with energy.   She can try MVI, B complex in afternoon.

## 2014-05-21 DIAGNOSIS — M1711 Unilateral primary osteoarthritis, right knee: Secondary | ICD-10-CM | POA: Insufficient documentation

## 2014-06-04 ENCOUNTER — Telehealth: Payer: Self-pay

## 2014-06-04 NOTE — Telephone Encounter (Signed)
Spoke with patient and she said taking the 3 pills caused her to have an anxiety attack, she said she had not take 3 since June. She said she was trying to cut back but she was not able to get down to taking one. She said right now she is feeing better but she want to know if there was something else that she could take. She thinks the Levothyroxine is causing her to break out in sweats . She said 45 minutes after taking the medication she feels like someone has thrown a bucket of water on her. Please advise on both med's      KP

## 2014-06-04 NOTE — Telephone Encounter (Signed)
She needs an ov--- a lot of her symptoms are probably because she decreased effexor too fast

## 2014-06-04 NOTE — Telephone Encounter (Signed)
Apt scheduled for 06/05/14 at 1 pm.     KP

## 2014-06-04 NOTE — Telephone Encounter (Signed)
Caller name:Tawny Relation to pt: Call back number:[phone] Pharmacy:  Reason for call: Denise Lowery called and said she needs to talk to you about her venlafaxine XR [facility]), and school getting ready to start, she needs something else that will work better,

## 2014-06-05 ENCOUNTER — Ambulatory Visit (INDEPENDENT_AMBULATORY_CARE_PROVIDER_SITE_OTHER): Payer: BC Managed Care – PPO | Admitting: Family Medicine

## 2014-06-05 ENCOUNTER — Encounter: Payer: Self-pay | Admitting: Family Medicine

## 2014-06-05 VITALS — BP 114/74 | HR 70 | Temp 97.9°F | Wt 190.0 lb

## 2014-06-05 DIAGNOSIS — F411 Generalized anxiety disorder: Secondary | ICD-10-CM

## 2014-06-05 MED ORDER — FLUOXETINE HCL 20 MG PO TABS: 20.0000 mg | ORAL_TABLET | Freq: Every day | ORAL | Status: DC

## 2014-06-05 NOTE — Progress Notes (Signed)
Pre visit review using our clinic review tool, if applicable. No additional management support is needed unless otherwise documented below in the visit note. 

## 2014-06-05 NOTE — Patient Instructions (Signed)
Generalized Anxiety Disorder Generalized anxiety disorder (GAD) is a mental disorder. It interferes with life functions, including relationships, work, and school. GAD is different from normal anxiety, which everyone experiences at some point in their lives in response to specific life events and activities. Normal anxiety actually helps us prepare for and get through these life events and activities. Normal anxiety goes away after the event or activity is over.  GAD causes anxiety that is not necessarily related to specific events or activities. It also causes excess anxiety in proportion to specific events or activities. The anxiety associated with GAD is also difficult to control. GAD can vary from mild to severe. People with severe GAD can have intense waves of anxiety with physical symptoms (panic attacks).  SYMPTOMS The anxiety and worry associated with GAD are difficult to control. This anxiety and worry are related to many life events and activities and also occur more days than not for 6 months or longer. People with GAD also have three or more of the following symptoms (one or more in children):  Restlessness.   Fatigue.  Difficulty concentrating.   Irritability.  Muscle tension.  Difficulty sleeping or unsatisfying sleep. DIAGNOSIS GAD is diagnosed through an assessment by your health care provider. Your health care provider will ask you questions aboutyour mood,physical symptoms, and events in your life. Your health care provider may ask you about your medical history and use of alcohol or drugs, including prescription medicines. Your health care provider may also do a physical exam and blood tests. Certain medical conditions and the use of certain substances can cause symptoms similar to those associated with GAD. Your health care provider may refer you to a mental health specialist for further evaluation. TREATMENT The following therapies are usually used to treat GAD:    Medication. Antidepressant medication usually is prescribed for long-term daily control. Antianxiety medicines may be added in severe cases, especially when panic attacks occur.   Talk therapy (psychotherapy). Certain types of talk therapy can be helpful in treating GAD by providing support, education, and guidance. A form of talk therapy called cognitive behavioral therapy can teach you healthy ways to think about and react to daily life events and activities.  Stress managementtechniques. These include yoga, meditation, and exercise and can be very helpful when they are practiced regularly. A mental health specialist can help determine which treatment is best for you. Some people see improvement with one therapy. However, other people require a combination of therapies. Document Released: 01/30/2013 Document Revised: 02/19/2014 Document Reviewed: 01/30/2013 [employer] Patient Information 2015 [employer]. This information is not intended to replace advice given to you by your health care provider. Make sure you discuss any questions you have with your health care provider.  

## 2014-06-05 NOTE — Progress Notes (Signed)
   Subjective:    Patient ID: Denise Lowery, female    DOB: 03/03/1946, 68 y.o.   MRN: [id number]  HPI Pt here because of severe hot flashes on effexor.  She tried to dec to 2 cap a day but then had a severe panic attack and had to go back up to 3 but hot flashes are terrible.     Review of Systems    as above Objective:   Physical Exam BP 114/74  Pulse 70  Temp(Src) 97.9 F (36.6 C) (Oral)  Wt 190 lb (86.183 kg)  SpO2 98% General appearance: alert, cooperative, appears stated age and no distress Neurologic: Grossly normal       Assessment & Plan:  1. Generalized anxiety disorder Wean off effexor---go back down to 2 cap a day and start prozac--- after 2 weeks try to decrease effexor to 1 cap a day and then stop in another 2 weeks - FLUoxetine (PROZAC) 20 MG tablet; Take 1 tablet (20 mg total) by mouth daily.  Dispense: 30 tablet; Refill: 3

## 2014-06-19 ENCOUNTER — Other Ambulatory Visit: Payer: Self-pay | Admitting: Family Medicine

## 2014-08-21 ENCOUNTER — Telehealth: Payer: Self-pay | Admitting: *Deleted

## 2014-08-21 ENCOUNTER — Telehealth: Payer: Self-pay | Admitting: Family Medicine

## 2014-08-21 DIAGNOSIS — K2100 Gastro-esophageal reflux disease with esophagitis, without bleeding: Secondary | ICD-10-CM

## 2014-08-21 MED ORDER — OMEPRAZOLE 40 MG PO CPDR: 40.0000 mg | DELAYED_RELEASE_CAPSULE | Freq: Every day | ORAL | Status: DC

## 2014-08-21 NOTE — Telephone Encounter (Signed)
Caller name: Rami, Waddle Relation to pt: self  Call back number: [phone] [facility]: [facility] [phone]  Reason for call: pt requesting a refill of acid reflux rx

## 2014-08-21 NOTE — Telephone Encounter (Signed)
Caller name: Emmery, Seiler Relation to pt: self  Call back number: [phone] [facility]: [facility] [phone]  Reason for call:   pt requesting refill of acid reflux rx

## 2014-08-21 NOTE — Telephone Encounter (Signed)
Received surgical clearance via fax from [facility]. Called and informed (left detailed message) patient that she will need to schedule a surgery clearance appt with Dr. Etter Sjogren.

## 2014-08-29 ENCOUNTER — Ambulatory Visit: Payer: Self-pay | Admitting: Medical

## 2014-08-31 ENCOUNTER — Ambulatory Visit (INDEPENDENT_AMBULATORY_CARE_PROVIDER_SITE_OTHER): Payer: BC Managed Care – PPO | Admitting: Medical

## 2014-08-31 ENCOUNTER — Encounter: Payer: Self-pay | Admitting: Medical

## 2014-08-31 VITALS — BP 120/80 | HR 68 | Temp 97.6°F | Ht 67.05 in | Wt 186.8 lb

## 2014-08-31 DIAGNOSIS — Z01818 Encounter for other preprocedural examination: Secondary | ICD-10-CM

## 2014-08-31 NOTE — Patient Instructions (Addendum)
Your exam is normal and your current chronic conditions are under control except for your rt knee pain.  I need you to sign a release of information form so we can get your ekg and labs which were done at the hospital today. Without this I can't clear your.   We will wait for those to come in and then call you.   If you get ill prior to your surgery date let us know and surgeon as that could effect your clearance status.  Follow up as needed.

## 2014-08-31 NOTE — Progress Notes (Signed)
Subjective:    Patient ID: Denise Lowery, female    DOB: [birth year], 68 y.o.   MRN: [id number]  HPI  Pt in states she in for preop for rt knee. Total knee replacement rt knee. Pt had ekg and blood work done at [facility] today . Pt had surgeries before all under general anesthesia. Even tonsillectomy n 1996. No prior reaction to anesthesia. No blood transufusion. No hx of MI or stroke. No hx of lung disease. No pulmonary fibrosis. No clotting disorders. Normal platlets in the summer.No recent illness. Pt has no sleep apnea hx. No family history of anesthesia reaction. Pt did have surgery with general anesthesia in 2008 and did well.  Pt Orthopedic Surgeon Coralie Keens  Pt current condition are undercontrol.  Past Medical History  Diagnosis Date  . Environmental allergies   . Bronchitis   . Thyroid disease     History   Social History  . Marital Status: Single    Spouse Name: N/A    Number of Children: N/A  . Years of Education: N/A   Occupational History  . Not on file.   Social History Main Topics  . Smoking status: Never Smoker   . Smokeless tobacco: Never Used  . Alcohol Use: Yes     Comment: Occ  . Drug Use: No  . Sexual Activity: Not on file   Other Topics Concern  . Not on file   Social History Narrative   Exercise-- 3x a week,  Walking, weights, ellipitical, bike           Past Surgical History  Procedure Laterality Date  . Tonsillectomy  1998  . Appendectomy  1972  . Knee surgery      RIGHT    Family History  Problem Relation Age of Onset  . Stroke Father 73    Allergies  Allergen Reactions  . Penicillins     Current Outpatient Prescriptions on File Prior to Visit  Medication Sig Dispense Refill  . cetirizine (ZYRTEC) 10 MG tablet As directed    . FLUoxetine (PROZAC) 20 MG tablet Take 1 tablet (20 mg total) by mouth daily. 30 tablet 3  . levothyroxine (SYNTHROID, LEVOTHROID) 75 MCG tablet TAKE 1 TABLET BY MOUTH EVERY DAY 30 tablet 11    . multivitamin (THERAGRAN) per tablet      . omeprazole (PRILOSEC) 40 MG capsule Take 1 capsule (40 mg total) by mouth daily. 30 capsule 3  . venlafaxine XR (EFFEXOR-XR) 75 MG 24 hr capsule TAKE 3 CAPSULES BY MOUTH DAILY 90 capsule 5   No current facility-administered medications on file prior to visit.    BP 120/80 mmHg  Pulse 68  Temp(Src) 97.6 F (36.4 C) (Oral)  Ht 5' 7.05" (1.703 m)  Wt 186 lb 12.8 oz (84.732 kg)  BMI 29.22 kg/m2  SpO2 96%     Review of Systems  Constitutional: Negative for fever, chills and fatigue.  HENT: Negative for congestion, ear discharge, ear pain, nosebleeds, postnasal drip, rhinorrhea, sinus pressure, sore throat and trouble swallowing.   Respiratory: Negative for cough, chest tightness, shortness of breath and wheezing.   Cardiovascular: Negative for chest pain and palpitations.  Gastrointestinal: Negative for nausea, vomiting, abdominal pain, diarrhea and constipation.  Genitourinary: Negative for dysuria and flank pain.  Musculoskeletal: Negative for back pain.       Does have rt knee pain.  Neurological: Negative for dizziness, tremors, seizures, syncope, weakness, light-headedness, numbness and headaches.  Hematological: Negative for adenopathy. Does  not bruise/bleed easily.  Psychiatric/Behavioral: Negative for suicidal ideas, behavioral problems and dysphoric mood. The patient is not nervous/anxious.        Objective:   Physical Exam  Constitutional: She is oriented to person, place, and time. She appears well-developed and well-nourished. No distress.  HENT:  Head: Normocephalic and atraumatic.  Right Ear: External ear normal.  Left Ear: External ear normal.  Eyes: Conjunctivae and EOM are normal. Pupils are equal, round, and reactive to light. Right eye exhibits no discharge. Left eye exhibits no discharge. No scleral icterus.  Neck: Normal range of motion. Neck supple. No JVD present. No tracheal deviation present. No thyromegaly  present.  Cardiovascular: Normal rate, regular rhythm and normal heart sounds.  Exam reveals no gallop and no friction rub.   No murmur heard. Pulmonary/Chest: Effort normal and breath sounds normal. No stridor. No respiratory distress. She has no wheezes. She has no rales. She exhibits no tenderness.  Abdominal: Soft. Bowel sounds are normal. She exhibits no distension and no mass. There is no tenderness. There is no rebound and no guarding.  Musculoskeletal:  Rt knee- mild tender to palpation and flexion and extension.  Lymphadenopathy:    She has no cervical adenopathy.  Neurological: She is alert and oriented to person, place, and time. No cranial nerve deficit. Coordination normal.  Skin: She is not diaphoretic.  Psychiatric: She has a normal mood and affect. Her behavior is normal. Judgment and thought content normal.           Assessment & Plan:

## 2014-08-31 NOTE — Progress Notes (Signed)
Pre visit review using our clinic review tool, if applicable. No additional management support is needed unless otherwise documented below in the visit note. 

## 2014-08-31 NOTE — Assessment & Plan Note (Addendum)
Patient appear to be stable with chronic conditions. No history of any heart disease or lung disease. SEE hpi as well. At present I am awaiting on labs done today at [facility] and ekg done. Then will give my opinion on surgery.

## 2014-09-21 DIAGNOSIS — Z471 Aftercare following joint replacement surgery: Secondary | ICD-10-CM | POA: Insufficient documentation

## 2014-09-21 DIAGNOSIS — Z96651 Presence of right artificial knee joint: Secondary | ICD-10-CM | POA: Insufficient documentation

## 2014-09-29 ENCOUNTER — Other Ambulatory Visit: Payer: Self-pay | Admitting: Family Medicine

## 2014-12-25 ENCOUNTER — Other Ambulatory Visit: Payer: Self-pay | Admitting: Family Medicine

## 2014-12-27 ENCOUNTER — Encounter: Payer: Self-pay | Admitting: Family Medicine

## 2015-01-07 ENCOUNTER — Telehealth: Payer: Self-pay | Admitting: *Deleted

## 2015-01-07 NOTE — Telephone Encounter (Signed)
Unable to reach patient at time of Pre Visit Call.   

## 2015-01-08 ENCOUNTER — Encounter: Payer: Self-pay | Admitting: Family Medicine

## 2015-01-08 ENCOUNTER — Ambulatory Visit (INDEPENDENT_AMBULATORY_CARE_PROVIDER_SITE_OTHER): Payer: BC Managed Care – PPO | Admitting: Family Medicine

## 2015-01-08 VITALS — BP 114/78 | HR 76 | Temp 98.1°F | Ht 67.0 in | Wt 184.6 lb

## 2015-01-08 DIAGNOSIS — E039 Hypothyroidism, unspecified: Secondary | ICD-10-CM

## 2015-01-08 DIAGNOSIS — Z136 Encounter for screening for cardiovascular disorders: Secondary | ICD-10-CM

## 2015-01-08 DIAGNOSIS — F430 Acute stress reaction: Secondary | ICD-10-CM

## 2015-01-08 DIAGNOSIS — E2839 Other primary ovarian failure: Secondary | ICD-10-CM

## 2015-01-08 DIAGNOSIS — Z0000 Encounter for general adult medical examination without abnormal findings: Secondary | ICD-10-CM

## 2015-01-08 DIAGNOSIS — K2100 Gastro-esophageal reflux disease with esophagitis, without bleeding: Secondary | ICD-10-CM

## 2015-01-08 DIAGNOSIS — Z1231 Encounter for screening mammogram for malignant neoplasm of breast: Secondary | ICD-10-CM

## 2015-01-08 DIAGNOSIS — R8290 Unspecified abnormal findings in urine: Secondary | ICD-10-CM

## 2015-01-08 DIAGNOSIS — Z23 Encounter for immunization: Secondary | ICD-10-CM

## 2015-01-08 LAB — POCT URINALYSIS DIPSTICK
Bilirubin, UA: NEGATIVE
Ketones, UA: NEGATIVE
Nitrite, UA: NEGATIVE
PROTEIN UA: NEGATIVE
RBC UA: NEGATIVE
Spec Grav, UA: 1.01
UROBILINOGEN UA: NEGATIVE
pH, UA: 6

## 2015-01-08 MED ORDER — PNEUMOCOCCAL 13-VAL CONJ VACC IM SUSP: 0.5000 mL | INTRAMUSCULAR | Status: DC

## 2015-01-08 MED ORDER — OMEPRAZOLE 40 MG PO CPDR: 40.0000 mg | DELAYED_RELEASE_CAPSULE | Freq: Every day | ORAL | Status: DC

## 2015-01-08 NOTE — Progress Notes (Signed)
Subjective:    Denise Lowery is a 68 y.o. female who presents for Medicare Annual/Subsequent preventive examination.  Preventive Screening-Counseling & Management  Tobacco History  Smoking status  . Never Smoker   Smokeless tobacco  . Never Used     Problems Prior to Visit 1.  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 Preoperative evaluation to rule out surgical contraindication 08/31/2014  . Obesity (BMI 30-39.9) 04/09/2014  . Left flank pain 02/13/2014  . Obesity 07/27/2012  . HYPOTHYROIDISM 10/21/2010  . SHOULDER PAIN, LEFT 10/21/2010  . KNEE PAIN 10/21/2010  . LUMBAGO 10/21/2010    Medications Prior to Visit Current Outpatient Prescriptions on File Prior to Visit  Medication Sig Dispense Refill  . cetirizine (ZYRTEC) 10 MG tablet As directed    . FLUoxetine (PROZAC) 20 MG tablet TAKE 1 TABLET BY MOUTH DAILY 30 tablet 0  . levothyroxine (SYNTHROID, LEVOTHROID) 75 MCG tablet TAKE 1 TABLET BY MOUTH EVERY DAY 30 tablet 11  . multivitamin (THERAGRAN) per tablet      . omeprazole (PRILOSEC) 40 MG capsule Take 1 capsule (40 mg total) by mouth daily. 30 capsule 3  . venlafaxine XR (EFFEXOR-XR) 75 MG 24 hr capsule TAKE 3 CAPSULES BY MOUTH DAILY (Patient taking differently: TAKE 2 CAPSULES BY MOUTH DAILY) 90 capsule 5   No current facility-administered medications on file prior to visit.    Current Medications (verified) Current Outpatient Prescriptions  Medication Sig Dispense Refill  . cetirizine (ZYRTEC) 10 MG tablet As directed    . FLUoxetine (PROZAC) 20 MG tablet TAKE 1 TABLET BY MOUTH DAILY 30 tablet 0  . levothyroxine (SYNTHROID, LEVOTHROID) 75 MCG tablet TAKE 1 TABLET BY MOUTH EVERY DAY 30 tablet 11   . multivitamin (THERAGRAN) per tablet      . omeprazole (PRILOSEC) 40 MG capsule Take 1 capsule (40 mg total) by mouth daily. 30 capsule 3  . venlafaxine XR (EFFEXOR-XR) 75 MG 24 hr capsule TAKE 3 CAPSULES BY MOUTH DAILY (Patient taking differently: TAKE 2 CAPSULES BY MOUTH DAILY) 90 capsule 5   No current facility-administered medications for this visit.     Allergies (verified) Penicillins   PAST HISTORY  Family History Family History  Problem Relation Age of Onset  . Stroke Father 72    Social History History  Substance Use Topics  . Smoking status: Never Smoker   . Smokeless tobacco: Never Used  . Alcohol Use: Yes     Comment: Occ     Are there smokers in your home (other than you)? No  Risk Factors Current exercise habits: Gym/ health club routine includes cardio, light weights and walking on track .  Dietary issues discussed: na   Cardiac risk factors: advanced age (older than 86 for men, 28 for women).  Depression Screen (Note: if answer to either of the following is "Yes", a more complete depression screening is indicated)   Over the past two weeks, have you felt down, depressed or hopeless? No  Over the past two weeks, have you felt little interest or pleasure in doing  things? No  Have you lost interest or pleasure in daily life? No  Do you often feel hopeless? No  Do you cry easily over simple problems? No  Activities of Daily Living In your present state of health, do you have any difficulty performing the following activities?:  Driving? No Managing money?  No Feeding yourself? No Getting from bed to chair? No Climbing a flight of stairs? No Preparing food and eating?: No Bathing or showering? No Getting dressed: No Getting to the toilet? No Using the toilet:No Moving around from place to place: No In the past year have you fallen or had a near fall?:No   Are you sexually active?  No  Do you have more than one partner?  No  Hearing  Difficulties: No Do you often ask people to speak up or repeat themselves? No Do you experience ringing or noises in your ears? No Do you have difficulty understanding soft or whispered voices? No   Do you feel that you have a problem with memory? No  Do you often misplace items? No  Do you feel safe at home?  Yes  Cognitive Testing  Alert? Yes  Normal Appearance?Yes  Oriented to person? Yes  Place? Yes   Time? Yes  Recall of three objects?  Yes  Can perform simple calculations? Yes  Displays appropriate judgment?Yes  Can read the correct time from a watch face?Yes   Advanced Directives have been discussed with the patient? Yes  List the Names of Other Physician/Practitioners you currently use: 1.  Dentist-- Denise Lowery   Indicate any recent Medical Services you may have received from other than [facility] providers in the past year (date may be approximate).  Immunization History  Administered Date(s) Administered  . Influenza-Unspecified 08/19/2014  . Pneumococcal Polysaccharide-23 12/25/2013  . Tdap 10/20/1999, 07/26/2012    Screening Tests Health Maintenance  Topic Date Due  . COLONOSCOPY  08/18/1997  . ZOSTAVAX  08/19/2007  . DEXA SCAN  08/18/2012  . MAMMOGRAM  05/19/2014  . PNA vac Low Risk Adult (2 of 2 - PCV13) 12/26/2014  . INFLUENZA VACCINE  05/20/2015  . TETANUS/TDAP  07/26/2022    All answers were reviewed with the patient and necessary referrals were made:  Denise Koyanagi, DO   01/08/2015   History reviewed:  She  has a past medical history of Environmental allergies; Bronchitis; and Thyroid disease. She  does not have any pertinent problems on file. She  has past surgical history that includes Tonsillectomy (1998); Appendectomy (1972); and Knee surgery. Her family history includes Heart disease (age of onset: 27) in her brother; Stroke (age of onset: 39) in her father. She  reports that she has never smoked. She has never used smokeless tobacco. She reports that  she drinks alcohol. She reports that she does not use illicit drugs. She has a current medication list which includes the following prescription(s): cetirizine, fluoxetine, levothyroxine, multivitamin, omeprazole, and venlafaxine xr. Current Outpatient Prescriptions on File Prior to Visit  Medication Sig Dispense Refill  . cetirizine (ZYRTEC) 10 MG tablet As directed    . FLUoxetine (PROZAC) 20 MG tablet TAKE 1 TABLET BY MOUTH DAILY 30 tablet 0  . levothyroxine (SYNTHROID, LEVOTHROID) 75 MCG tablet TAKE 1 TABLET BY MOUTH EVERY DAY 30 tablet 11  . multivitamin (THERAGRAN) per tablet      . omeprazole (PRILOSEC) 40 MG capsule Take 1 capsule (40 mg total) by mouth daily. 30 capsule 3  . venlafaxine XR (EFFEXOR-XR) 75 MG 24 hr  capsule TAKE 3 CAPSULES BY MOUTH DAILY (Patient taking differently: TAKE 2 CAPSULES BY MOUTH DAILY) 90 capsule 5   No current facility-administered medications on file prior to visit.   She is allergic to penicillins.  Review of Systems  Review of Systems  Constitutional: Negative for activity change, appetite change and fatigue.  HENT: Negative for hearing loss, congestion, tinnitus and ear discharge.   Eyes: Negative for visual disturbance (see optho q1y -- vision corrected to 20/20 with glasses).  Respiratory: Negative for cough, chest tightness and shortness of breath.   Cardiovascular: Negative for chest pain, palpitations and leg swelling.  Gastrointestinal: Negative for abdominal pain, diarrhea, constipation and abdominal distention.  Genitourinary: Negative for urgency, frequency, decreased urine volume and difficulty urinating.  Musculoskeletal: Negative for back pain, arthralgias and gait problem.  Skin: Negative for color change, pallor and rash.  Neurological: Negative for dizziness, light-headedness, numbness and headaches.  Hematological: Negative for adenopathy. Does not bruise/bleed easily.  Psychiatric/Behavioral: Negative for suicidal ideas,  confusion, sleep disturbance, self-injury, dysphoric mood, decreased concentration and agitation.  Pt is able to read and write and can do all ADLs No risk for falling No abuse/ violence in home      Objective:     Vision by Snellen chart: opth  Body mass index is 28.91 kg/(m^2). BP 114/78 mmHg  Pulse 76  Temp(Src) 98.1 F (36.7 C) (Oral)  Ht 5\' 7"  (1.702 m)  Wt 184 lb 9.6 oz (83.734 kg)  BMI 28.91 kg/m2  SpO2 97%  BP 114/78 mmHg  Pulse 76  Temp(Src) 98.1 F (36.7 C) (Oral)  Ht 5\' 7"  (1.702 m)  Wt 184 lb 9.6 oz (83.734 kg)  BMI 28.91 kg/m2  SpO2 97% General appearance: alert, cooperative, appears stated age and no distress Head: Normocephalic, without obvious abnormality, atraumatic Eyes: conjunctivae/corneas clear. PERRL, EOM's intact. Fundi benign. Ears: normal TM's and external ear canals both ears Nose: Nares normal. Septum midline. Mucosa normal. No drainage or sinus tenderness. Throat: lips, mucosa, and tongue normal; teeth and gums normal Neck: no adenopathy, no carotid bruit, no JVD, supple, symmetrical, trachea midline and thyroid not enlarged, symmetric, no tenderness/mass/nodules Back: symmetric, no curvature. ROM normal. No CVA tenderness. Lungs: clear to auscultation bilaterally Breasts: normal appearance, no masses or tenderness Heart: regular rate and rhythm, S1, S2 normal, no murmur, click, rub or gallop Abdomen: soft, non-tender; bowel sounds normal; no masses,  no organomegaly Pelvic: deferred Extremities: extremities normal, atraumatic, no cyanosis or edema Pulses: 2+ and symmetric Skin: Skin color, texture, turgor normal. No rashes or lesions Lymph nodes: Cervical, supraclavicular, and axillary nodes normal. Neurologic: Alert and oriented X 3, normal strength and tone. Normal symmetric reflexes. Normal coordination and gait Psych- no depression, no anxiety      Assessment:     cpe      Plan:     During the course of the visit the patient  was educated and counseled about appropriate screening and preventive services including:    Pneumococcal vaccine   Influenza vaccine  Td vaccine  Screening mammography  Screening Pap smear and pelvic exam   Bone densitometry screening  Colorectal cancer screening  Diabetes screening  Glaucoma screening  Advanced directives: has an advanced directive - a copy HAS NOT been provided. x Diet review for nutrition referral? Yes ____  Not Indicated _x___   Patient Instructions (the written plan) was given to the patient.  Medicare Attestation I have personally reviewed: The patient's medical and social history Their use  of alcohol, tobacco or illicit drugs Their current medications and supplements The patient's functional ability including ADLs,fall risks, home safety risks, cognitive, and hearing and visual impairment Diet and physical activities Evidence for depression or mood disorders  The patient's weight, height, BMI, and visual acuity have been recorded in the chart.  I have made referrals, counseling, and provided education to the patient based on review of the above and I have provided the patient with a written personalized care plan for preventive services.    1. Estrogen deficiency  - DG Bone Density; Future  2. Encounter for screening mammogram for breast cancer  - MM DIGITAL SCREENING BILATERAL; Future  3. Need for prophylactic vaccination against Streptococcus pneumoniae (pneumococcus)  - pneumococcal 13-valent conjugate vaccine (PREVNAR 13) SUSP injection; Inject 0.5 mLs into the muscle tomorrow at 10 am.  Dispense: 0.5 mL; Refill: 0  4. Gastroesophageal reflux disease with esophagitis  - omeprazole (PRILOSEC) 40 MG capsule; Take 1 capsule (40 mg total) by mouth daily.  Dispense: 90 capsule; Refill: 3  5. Hypothyroidism, unspecified hypothyroidism type con't synthroid - Basic metabolic panel - CBC with Differential/Platelet - Hepatic function  panel - Lipid panel - POCT urinalysis dipstick - TSH  6. Stress reaction  - Vitamin B12  7. Ischemic heart disease screen   - Basic metabolic panel - CBC with Differential/Platelet - Hepatic function panel - Lipid panel - POCT urinalysis dipstick  8. Preventative health care   - Ambulatory referral to Gastroenterology  9. Routine history and physical examination of adult    Denise Koyanagi, DO   01/08/2015

## 2015-01-08 NOTE — Addendum Note (Signed)
Addended by: Modena Morrow D on: 01/08/2015 04:58 PM   Modules accepted: Orders

## 2015-01-08 NOTE — Patient Instructions (Addendum)
Preventive Care for Adults A healthy lifestyle and preventive care can promote health and wellness. Preventive health guidelines for women include the following key practices.  A routine yearly physical is a good way to check with your health care provider about your health and preventive screening. It is a chance to share any concerns and updates on your health and to receive a thorough exam.  Visit your dentist for a routine exam and preventive care every 6 months. Brush your teeth twice a day and floss once a day. Good oral hygiene prevents tooth decay and gum disease.  The frequency of eye exams is based on your age, health, family medical history, use of contact lenses, and other factors. Follow your health care provider's recommendations for frequency of eye exams.  Eat a healthy diet. Foods like vegetables, fruits, whole grains, low-fat dairy products, and lean protein foods contain the nutrients you need without too many calories. Decrease your intake of foods high in solid fats, added sugars, and salt. Eat the right amount of calories for you.Get information about a proper diet from your health care provider, if necessary.  Regular physical exercise is one of the most important things you can do for your health. Most adults should get at least 150 minutes of moderate-intensity exercise (any activity that increases your heart rate and causes you to sweat) each week. In addition, most adults need muscle-strengthening exercises on 2 or more days a week.  Maintain a healthy weight. The body mass index (BMI) is a screening tool to identify possible weight problems. It provides an estimate of body fat based on height and weight. Your health care provider can find your BMI and can help you achieve or maintain a healthy weight.For adults 20 years and older:  A BMI below 18.5 is considered underweight.  A BMI of 18.5 to 24.9 is normal.  A BMI of 25 to 29.9 is considered overweight.  A BMI of  30 and above is considered obese.  Maintain normal blood lipids and cholesterol levels by exercising and minimizing your intake of saturated fat. Eat a balanced diet with plenty of fruit and vegetables. Blood tests for lipids and cholesterol should begin at age 76 and be repeated every 5 years. If your lipid or cholesterol levels are high, you are over 50, or you are at high risk for heart disease, you may need your cholesterol levels checked more frequently.Ongoing high lipid and cholesterol levels should be treated with medicines if diet and exercise are not working.  If you smoke, find out from your health care provider how to quit. If you do not use tobacco, do not start.  Lung cancer screening is recommended for adults aged 22-80 years who are at high risk for developing lung cancer because of a history of smoking. A yearly low-dose CT scan of the lungs is recommended for people who have at least a 30-pack-year history of smoking and are a current smoker or have quit within the past 15 years. A pack year of smoking is smoking an average of 1 pack of cigarettes a day for 1 year (for example: 1 pack a day for 30 years or 2 packs a day for 15 years). Yearly screening should continue until the smoker has stopped smoking for at least 15 years. Yearly screening should be stopped for people who develop a health problem that would prevent them from having lung cancer treatment.  If you are pregnant, do not drink alcohol. If you are breastfeeding,  be very cautious about drinking alcohol. If you are not pregnant and choose to drink alcohol, do not have more than 1 drink per day. One drink is considered to be 12 ounces (355 mL) of beer, 5 ounces (148 mL) of wine, or 1.5 ounces (44 mL) of liquor.  Avoid use of street drugs. Do not share needles with anyone. Ask for help if you need support or instructions about stopping the use of drugs.  High blood pressure causes heart disease and increases the risk of  stroke. Your blood pressure should be checked at least every 1 to 2 years. Ongoing high blood pressure should be treated with medicines if weight loss and exercise do not work.  If you are 3-86 years old, ask your health care provider if you should take aspirin to prevent strokes.  Diabetes screening involves taking a blood sample to check your fasting blood sugar level. This should be done once every 3 years, after age 67, if you are within normal weight and without risk factors for diabetes. Testing should be considered at a younger age or be carried out more frequently if you are overweight and have at least 1 risk factor for diabetes.  Breast cancer screening is essential preventive care for women. You should practice "breast self-awareness." This means understanding the normal appearance and feel of your breasts and may include breast self-examination. Any changes detected, no matter how small, should be reported to a health care provider. Women in their 8s and 30s should have a clinical breast exam (CBE) by a health care provider as part of a regular health exam every 1 to 3 years. After age 70, women should have a CBE every year. Starting at age 25, women should consider having a mammogram (breast X-ray test) every year. Women who have a family history of breast cancer should talk to their health care provider about genetic screening. Women at a high risk of breast cancer should talk to their health care providers about having an MRI and a mammogram every year.  Breast cancer gene (BRCA)-related cancer risk assessment is recommended for women who have family members with BRCA-related cancers. BRCA-related cancers include breast, ovarian, tubal, and peritoneal cancers. Having family members with these cancers may be associated with an increased risk for harmful changes (mutations) in the breast cancer genes BRCA1 and BRCA2. Results of the assessment will determine the need for genetic counseling and  BRCA1 and BRCA2 testing.  Routine pelvic exams to screen for cancer are no longer recommended for nonpregnant women who are considered low risk for cancer of the pelvic organs (ovaries, uterus, and vagina) and who do not have symptoms. Ask your health care provider if a screening pelvic exam is right for you.  If you have had past treatment for cervical cancer or a condition that could lead to cancer, you need Pap tests and screening for cancer for at least 20 years after your treatment. If Pap tests have been discontinued, your risk factors (such as having a new sexual partner) need to be reassessed to determine if screening should be resumed. Some women have medical problems that increase the chance of getting cervical cancer. In these cases, your health care provider may recommend more frequent screening and Pap tests.  The HPV test is an additional test that may be used for cervical cancer screening. The HPV test looks for the virus that can cause the cell changes on the cervix. The cells collected during the Pap test can be  tested for HPV. The HPV test could be used to screen women aged 30 years and older, and should be used in women of any age who have unclear Pap test results. After the age of 30, women should have HPV testing at the same frequency as a Pap test.  Colorectal cancer can be detected and often prevented. Most routine colorectal cancer screening begins at the age of 50 years and continues through age 75 years. However, your health care provider may recommend screening at an earlier age if you have risk factors for colon cancer. On a yearly basis, your health care provider may provide home test kits to check for hidden blood in the stool. Use of a small camera at the end of a tube, to directly examine the colon (sigmoidoscopy or colonoscopy), can detect the earliest forms of colorectal cancer. Talk to your health care provider about this at age 50, when routine screening begins. Direct  exam of the colon should be repeated every 5-10 years through age 75 years, unless early forms of pre-cancerous polyps or small growths are found.  People who are at an increased risk for hepatitis B should be screened for this virus. You are considered at high risk for hepatitis B if:  You were born in a country where hepatitis B occurs often. Talk with your health care provider about which countries are considered high risk.  Your parents were born in a high-risk country and you have not received a shot to protect against hepatitis B (hepatitis B vaccine).  You have HIV or AIDS.  You use needles to inject street drugs.  You live with, or have sex with, someone who has hepatitis B.  You get hemodialysis treatment.  You take certain medicines for conditions like cancer, organ transplantation, and autoimmune conditions.  Hepatitis C blood testing is recommended for all people born from 1945 through 1965 and any individual with known risks for hepatitis C.  Practice safe sex. Use condoms and avoid high-risk sexual practices to reduce the spread of sexually transmitted infections (STIs). STIs include gonorrhea, chlamydia, syphilis, trichomonas, herpes, HPV, and human immunodeficiency virus (HIV). Herpes, HIV, and HPV are viral illnesses that have no cure. They can result in disability, cancer, and death.  You should be screened for sexually transmitted illnesses (STIs) including gonorrhea and chlamydia if:  You are sexually active and are younger than 24 years.  You are older than 24 years and your health care provider tells you that you are at risk for this type of infection.  Your sexual activity has changed since you were last screened and you are at an increased risk for chlamydia or gonorrhea. Ask your health care provider if you are at risk.  If you are at risk of being infected with HIV, it is recommended that you take a prescription medicine daily to prevent HIV infection. This is  called preexposure prophylaxis (PrEP). You are considered at risk if:  You are a heterosexual woman, are sexually active, and are at increased risk for HIV infection.  You take drugs by injection.  You are sexually active with a partner who has HIV.  Talk with your health care provider about whether you are at high risk of being infected with HIV. If you choose to begin PrEP, you should first be tested for HIV. You should then be tested every 3 months for as long as you are taking PrEP.  Osteoporosis is a disease in which the bones lose minerals and strength   with aging. This can result in serious bone fractures or breaks. The risk of osteoporosis can be identified using a bone density scan. Women ages 65 years and over and women at risk for fractures or osteoporosis should discuss screening with their health care providers. Ask your health care provider whether you should take a calcium supplement or vitamin D to reduce the rate of osteoporosis.  Menopause can be associated with physical symptoms and risks. Hormone replacement therapy is available to decrease symptoms and risks. You should talk to your health care provider about whether hormone replacement therapy is right for you.  Use sunscreen. Apply sunscreen liberally and repeatedly throughout the day. You should seek shade when your shadow is shorter than you. Protect yourself by wearing long sleeves, pants, a wide-brimmed hat, and sunglasses year round, whenever you are outdoors.  Once a month, do a whole body skin exam, using a mirror to look at the skin on your back. Tell your health care provider of new moles, moles that have irregular borders, moles that are larger than a pencil eraser, or moles that have changed in shape or color.  Stay current with required vaccines (immunizations).  Influenza vaccine. All adults should be immunized every year.  Tetanus, diphtheria, and acellular pertussis (Td, Tdap) vaccine. Pregnant women should  receive 1 dose of Tdap vaccine during each pregnancy. The dose should be obtained regardless of the length of time since the last dose. Immunization is preferred during the 27th-36th week of gestation. An adult who has not previously received Tdap or who does not know her vaccine status should receive 1 dose of Tdap. This initial dose should be followed by tetanus and diphtheria toxoids (Td) booster doses every 10 years. Adults with an unknown or incomplete history of completing a 3-dose immunization series with Td-containing vaccines should begin or complete a primary immunization series including a Tdap dose. Adults should receive a Td booster every 10 years.  Varicella vaccine. An adult without evidence of immunity to varicella should receive 2 doses or a second dose if she has previously received 1 dose. Pregnant females who do not have evidence of immunity should receive the first dose after pregnancy. This first dose should be obtained before leaving the health care facility. The second dose should be obtained 4-8 weeks after the first dose.  Human papillomavirus (HPV) vaccine. Females aged 13-26 years who have not received the vaccine previously should obtain the 3-dose series. The vaccine is not recommended for use in pregnant females. However, pregnancy testing is not needed before receiving a dose. If a female is found to be pregnant after receiving a dose, no treatment is needed. In that case, the remaining doses should be delayed until after the pregnancy. Immunization is recommended for any person with an immunocompromised condition through the age of 26 years if she did not get any or all doses earlier. During the 3-dose series, the second dose should be obtained 4-8 weeks after the first dose. The third dose should be obtained 24 weeks after the first dose and 16 weeks after the second dose.  Zoster vaccine. One dose is recommended for adults aged 60 years or older unless certain conditions are  present.  Measles, mumps, and rubella (MMR) vaccine. Adults born before 1957 generally are considered immune to measles and mumps. Adults born in 1957 or later should have 1 or more doses of MMR vaccine unless there is a contraindication to the vaccine or there is laboratory evidence of immunity to   each of the three diseases. A routine second dose of MMR vaccine should be obtained at least 28 days after the first dose for students attending postsecondary schools, health care workers, or international travelers. People who received inactivated measles vaccine or an unknown type of measles vaccine during 1963-1967 should receive 2 doses of MMR vaccine. People who received inactivated mumps vaccine or an unknown type of mumps vaccine before 1979 and are at high risk for mumps infection should consider immunization with 2 doses of MMR vaccine. For females of childbearing age, rubella immunity should be determined. If there is no evidence of immunity, females who are not pregnant should be vaccinated. If there is no evidence of immunity, females who are pregnant should delay immunization until after pregnancy. Unvaccinated health care workers born before 1957 who lack laboratory evidence of measles, mumps, or rubella immunity or laboratory confirmation of disease should consider measles and mumps immunization with 2 doses of MMR vaccine or rubella immunization with 1 dose of MMR vaccine.  Pneumococcal 13-valent conjugate (PCV13) vaccine. When indicated, a person who is uncertain of her immunization history and has no record of immunization should receive the PCV13 vaccine. An adult aged 19 years or older who has certain medical conditions and has not been previously immunized should receive 1 dose of PCV13 vaccine. This PCV13 should be followed with a dose of pneumococcal polysaccharide (PPSV23) vaccine. The PPSV23 vaccine dose should be obtained at least 8 weeks after the dose of PCV13 vaccine. An adult aged 19  years or older who has certain medical conditions and previously received 1 or more doses of PPSV23 vaccine should receive 1 dose of PCV13. The PCV13 vaccine dose should be obtained 1 or more years after the last PPSV23 vaccine dose.  Pneumococcal polysaccharide (PPSV23) vaccine. When PCV13 is also indicated, PCV13 should be obtained first. All adults aged 65 years and older should be immunized. An adult younger than age 65 years who has certain medical conditions should be immunized. Any person who resides in a nursing home or long-term care facility should be immunized. An adult smoker should be immunized. People with an immunocompromised condition and certain other conditions should receive both PCV13 and PPSV23 vaccines. People with human immunodeficiency virus (HIV) infection should be immunized as soon as possible after diagnosis. Immunization during chemotherapy or radiation therapy should be avoided. Routine use of PPSV23 vaccine is not recommended for American Indians, [place] Natives, or people younger than 65 years unless there are medical conditions that require PPSV23 vaccine. When indicated, people who have unknown immunization and have no record of immunization should receive PPSV23 vaccine. One-time revaccination 5 years after the first dose of PPSV23 is recommended for people aged 19-64 years who have chronic kidney failure, nephrotic syndrome, asplenia, or immunocompromised conditions. People who received 1-2 doses of PPSV23 before age 65 years should receive another dose of PPSV23 vaccine at age 65 years or later if at least 5 years have passed since the previous dose. Doses of PPSV23 are not needed for people immunized with PPSV23 at or after age 65 years.  Meningococcal vaccine. Adults with asplenia or persistent complement component deficiencies should receive 2 doses of quadrivalent meningococcal conjugate (MenACWY-D) vaccine. The doses should be obtained at least 2 months apart.  Microbiologists working with certain meningococcal bacteria, military recruits, people at risk during an outbreak, and people who travel to or live in countries with a high rate of meningitis should be immunized. A first-year college student up through age   21 years who is living in a residence hall should receive a dose if she did not receive a dose on or after her 16th birthday. Adults who have certain high-risk conditions should receive one or more doses of vaccine.  Hepatitis A vaccine. Adults who wish to be protected from this disease, have certain high-risk conditions, work with hepatitis A-infected animals, work in hepatitis A research labs, or travel to or work in countries with a high rate of hepatitis A should be immunized. Adults who were previously unvaccinated and who anticipate close contact with an international adoptee during the first 60 days after arrival in the [place] from a country with a high rate of hepatitis A should be immunized.  Hepatitis B vaccine. Adults who wish to be protected from this disease, have certain high-risk conditions, may be exposed to blood or other infectious body fluids, are household contacts or sex partners of hepatitis B positive people, are clients or workers in certain care facilities, or travel to or work in countries with a high rate of hepatitis B should be immunized.  Haemophilus influenzae type b (Hib) vaccine. A previously unvaccinated person with asplenia or sickle cell disease or having a scheduled splenectomy should receive 1 dose of Hib vaccine. Regardless of previous immunization, a recipient of a hematopoietic stem cell transplant should receive a 3-dose series 6-12 months after her successful transplant. Hib vaccine is not recommended for adults with HIV infection. Preventive Services / Frequency Ages 64 to 68 years  Blood pressure check.** / Every 1 to 2 years.  Lipid and cholesterol check.** / Every 5 years beginning at age  22.  Clinical breast exam.** / Every 3 years for women in their 88s and 53s.  BRCA-related cancer risk assessment.** / For women who have family members with a BRCA-related cancer (breast, ovarian, tubal, or peritoneal cancers).  Pap test.** / Every 2 years from ages 90 through 51. Every 3 years starting at age 21 through age 56 or 3 with a history of 3 consecutive normal Pap tests.  HPV screening.** / Every 3 years from ages 24 through ages 1 to 46 with a history of 3 consecutive normal Pap tests.  Hepatitis C blood test.** / For any individual with known risks for hepatitis C.  Skin self-exam. / Monthly.  Influenza vaccine. / Every year.  Tetanus, diphtheria, and acellular pertussis (Tdap, Td) vaccine.** / Consult your health care provider. Pregnant women should receive 1 dose of Tdap vaccine during each pregnancy. 1 dose of Td every 10 years.  Varicella vaccine.** / Consult your health care provider. Pregnant females who do not have evidence of immunity should receive the first dose after pregnancy.  HPV vaccine. / 3 doses over 6 months, if 72 and younger. The vaccine is not recommended for use in pregnant females. However, pregnancy testing is not needed before receiving a dose.  Measles, mumps, rubella (MMR) vaccine.** / You need at least 1 dose of MMR if you were born in 1957 or later. You may also need a 2nd dose. For females of childbearing age, rubella immunity should be determined. If there is no evidence of immunity, females who are not pregnant should be vaccinated. If there is no evidence of immunity, females who are pregnant should delay immunization until after pregnancy.  Pneumococcal 13-valent conjugate (PCV13) vaccine.** / Consult your health care provider.  Pneumococcal polysaccharide (PPSV23) vaccine.** / 1 to 2 doses if you smoke cigarettes or if you have certain conditions.  Meningococcal vaccine.** /  1 dose if you are age 19 to 21 years and a first-year college  student living in a residence hall, or have one of several medical conditions, you need to get vaccinated against meningococcal disease. You may also need additional booster doses.  Hepatitis A vaccine.** / Consult your health care provider.  Hepatitis B vaccine.** / Consult your health care provider.  Haemophilus influenzae type b (Hib) vaccine.** / Consult your health care provider. Ages 40 to 64 years  Blood pressure check.** / Every 1 to 2 years.  Lipid and cholesterol check.** / Every 5 years beginning at age 20 years.  Lung cancer screening. / Every year if you are aged 55-80 years and have a 30-pack-year history of smoking and currently smoke or have quit within the past 15 years. Yearly screening is stopped once you have quit smoking for at least 15 years or develop a health problem that would prevent you from having lung cancer treatment.  Clinical breast exam.** / Every year after age 40 years.  BRCA-related cancer risk assessment.** / For women who have family members with a BRCA-related cancer (breast, ovarian, tubal, or peritoneal cancers).  Mammogram.** / Every year beginning at age 40 years and continuing for as long as you are in good health. Consult with your health care provider.  Pap test.** / Every 3 years starting at age 30 years through age 65 or 70 years with a history of 3 consecutive normal Pap tests.  HPV screening.** / Every 3 years from ages 30 years through ages 65 to 70 years with a history of 3 consecutive normal Pap tests.  Fecal occult blood test (FOBT) of stool. / Every year beginning at age 50 years and continuing until age 75 years. You may not need to do this test if you get a colonoscopy every 10 years.  Flexible sigmoidoscopy or colonoscopy.** / Every 5 years for a flexible sigmoidoscopy or every 10 years for a colonoscopy beginning at age 50 years and continuing until age 75 years.  Hepatitis C blood test.** / For all people born from 1945 through  1965 and any individual with known risks for hepatitis C.  Skin self-exam. / Monthly.  Influenza vaccine. / Every year.  Tetanus, diphtheria, and acellular pertussis (Tdap/Td) vaccine.** / Consult your health care provider. Pregnant women should receive 1 dose of Tdap vaccine during each pregnancy. 1 dose of Td every 10 years.  Varicella vaccine.** / Consult your health care provider. Pregnant females who do not have evidence of immunity should receive the first dose after pregnancy.  Zoster vaccine.** / 1 dose for adults aged 60 years or older.  Measles, mumps, rubella (MMR) vaccine.** / You need at least 1 dose of MMR if you were born in 1957 or later. You may also need a 2nd dose. For females of childbearing age, rubella immunity should be determined. If there is no evidence of immunity, females who are not pregnant should be vaccinated. If there is no evidence of immunity, females who are pregnant should delay immunization until after pregnancy.  Pneumococcal 13-valent conjugate (PCV13) vaccine.** / Consult your health care provider.  Pneumococcal polysaccharide (PPSV23) vaccine.** / 1 to 2 doses if you smoke cigarettes or if you have certain conditions.  Meningococcal vaccine.** / Consult your health care provider.  Hepatitis A vaccine.** / Consult your health care provider.  Hepatitis B vaccine.** / Consult your health care provider.  Haemophilus influenzae type b (Hib) vaccine.** / Consult your health care provider. Ages 65   years and over  Blood pressure check.** / Every 1 to 2 years.  Lipid and cholesterol check.** / Every 5 years beginning at age 24 years.  Lung cancer screening. / Every year if you are aged 50-80 years and have a 30-pack-year history of smoking and currently smoke or have quit within the past 15 years. Yearly screening is stopped once you have quit smoking for at least 15 years or develop a health problem that would prevent you from having lung cancer  treatment.  Clinical breast exam.** / Every year after age 50 years.  BRCA-related cancer risk assessment.** / For women who have family members with a BRCA-related cancer (breast, ovarian, tubal, or peritoneal cancers).  Mammogram.** / Every year beginning at age 9 years and continuing for as long as you are in good health. Consult with your health care provider.  Pap test.** / Every 3 years starting at age 81 years through age 36 or 17 years with 3 consecutive normal Pap tests. Testing can be stopped between 65 and 70 years with 3 consecutive normal Pap tests and no abnormal Pap or HPV tests in the past 10 years.  HPV screening.** / Every 3 years from ages 61 years through ages 63 or 83 years with a history of 3 consecutive normal Pap tests. Testing can be stopped between 65 and 70 years with 3 consecutive normal Pap tests and no abnormal Pap or HPV tests in the past 10 years.  Fecal occult blood test (FOBT) of stool. / Every year beginning at age 51 years and continuing until age 27 years. You may not need to do this test if you get a colonoscopy every 10 years.  Flexible sigmoidoscopy or colonoscopy.** / Every 5 years for a flexible sigmoidoscopy or every 10 years for a colonoscopy beginning at age 61 years and continuing until age 35 years.  Hepatitis C blood test.** / For all people born from [birth year] through 1964 and any individual with known risks for hepatitis C.  Osteoporosis screening.** / A one-time screening for women ages 51 years and over and women at risk for fractures or osteoporosis.  Skin self-exam. / Monthly.  Influenza vaccine. / Every year.  Tetanus, diphtheria, and acellular pertussis (Tdap/Td) vaccine.** / 1 dose of Td every 10 years.  Varicella vaccine.** / Consult your health care provider.  Zoster vaccine.** / 1 dose for adults aged 82 years or older.  Pneumococcal 13-valent conjugate (PCV13) vaccine.** / Consult your health care provider.  Pneumococcal  polysaccharide (PPSV23) vaccine.** / 1 dose for all adults aged 20 years and older.  Meningococcal vaccine.** / Consult your health care provider.  Hepatitis A vaccine.** / Consult your health care provider.  Hepatitis B vaccine.** / Consult your health care provider.  Haemophilus influenzae type b (Hib) vaccine.** / Consult your health care provider. ** Family history and personal history of risk and conditions may change your health care provider's recommendations. Document Released: 12/01/2001 Document Revised: 02/19/2014 Document Reviewed: 03/02/2011 [facility] Patient Information 2015 [place], [place]. This information is not intended to replace advice given to you by your health care provider. Make sure you discuss any questions you have with your health care provider.    Your Bone density and Mammogram are scheduled for January 18, 2015 at 3:30 pm at [facility]. The address is [address], [place], [place] [address] and the phone number [phone]

## 2015-01-08 NOTE — Progress Notes (Signed)
Pre visit review using our clinic review tool, if applicable. No additional management support is needed unless otherwise documented below in the visit note. 

## 2015-01-09 LAB — BASIC METABOLIC PANEL
BUN: 11 mg/dL (ref 6–23)
CALCIUM: 9.1 mg/dL (ref 8.4–10.5)
CO2: 28 mEq/L (ref 19–32)
Chloride: 98 mEq/L (ref 96–112)
Creatinine, Ser: 0.71 mg/dL (ref 0.40–1.20)
GFR: 87.17 mL/min (ref >60.00)
Glucose, Bld: 75 mg/dL (ref 70–99)
Potassium: 3.6 mEq/L (ref 3.5–5.1)
Sodium: 132 mEq/L — ABNORMAL LOW (ref 135–145)

## 2015-01-09 LAB — CBC WITH DIFFERENTIAL/PLATELET
BASOS PCT: 1.6 % (ref 0.0–3.0)
Basophils Absolute: 0.1 10*3/uL (ref 0.0–0.1)
EOS PCT: 6 % — AB (ref 0.0–5.0)
Eosinophils Absolute: 0.4 10*3/uL (ref 0.0–0.7)
HCT: 39.4 % (ref 36.0–46.0)
HEMOGLOBIN: 13.2 g/dL (ref 12.0–15.0)
LYMPHS PCT: 17 % (ref 12.0–46.0)
Lymphs Abs: 1.1 10*3/uL (ref 0.7–4.0)
MCHC: 33.5 g/dL (ref 30.0–36.0)
MCV: 91.9 fl (ref 78.0–100.0)
MONOS PCT: 4.7 % (ref 3.0–12.0)
Monocytes Absolute: 0.3 10*3/uL (ref 0.1–1.0)
Neutro Abs: 4.8 10*3/uL (ref 1.4–7.7)
Neutrophils Relative %: 70.7 % (ref 43.0–77.0)
Platelets: 303 10*3/uL (ref 150.0–400.0)
RBC: 4.29 Mil/uL (ref 3.87–5.11)
RDW: 13.6 % (ref 11.5–15.5)
WBC: 6.7 10*3/uL (ref 4.0–10.5)

## 2015-01-09 LAB — URINE CULTURE
Colony Count: NO GROWTH
Organism ID, Bacteria: NO GROWTH

## 2015-01-09 LAB — LIPID PANEL
Cholesterol: 212 mg/dL — ABNORMAL HIGH (ref 0–200)
HDL: 72 mg/dL (ref >39.00)
LDL CALC: 115 mg/dL — AB (ref 0–99)
NONHDL: 140
Total CHOL/HDL Ratio: 3
Triglycerides: 123 mg/dL (ref 0.0–149.0)
VLDL: 24.6 mg/dL (ref 0.0–40.0)

## 2015-01-09 LAB — VITAMIN B12: Vitamin B-12: 535 pg/mL (ref 211–911)

## 2015-01-09 LAB — HEPATIC FUNCTION PANEL
ALBUMIN: 4.3 g/dL (ref 3.5–5.2)
ALT: 11 U/L (ref 0–35)
AST: 19 U/L (ref 0–37)
Alkaline Phosphatase: 93 U/L (ref 39–117)
Bilirubin, Direct: 0.1 mg/dL (ref 0.0–0.3)
Total Bilirubin: 0.7 mg/dL (ref 0.2–1.2)
Total Protein: 7 g/dL (ref 6.0–8.3)

## 2015-01-09 LAB — TSH: TSH: 1.29 u[IU]/mL (ref 0.35–4.50)

## 2015-01-24 ENCOUNTER — Other Ambulatory Visit: Payer: Self-pay | Admitting: Family Medicine

## 2015-02-16 ENCOUNTER — Other Ambulatory Visit: Payer: Self-pay | Admitting: Family Medicine

## 2015-03-25 ENCOUNTER — Other Ambulatory Visit: Payer: Self-pay

## 2015-03-25 MED ORDER — LEVOTHYROXINE SODIUM 75 MCG PO TABS: 75.0000 ug | ORAL_TABLET | Freq: Every day | ORAL | Status: DC

## 2015-04-25 ENCOUNTER — Other Ambulatory Visit: Payer: Self-pay | Admitting: Family Medicine

## 2015-05-15 ENCOUNTER — Encounter: Payer: Self-pay | Admitting: Physician Assistant

## 2015-05-15 ENCOUNTER — Ambulatory Visit (INDEPENDENT_AMBULATORY_CARE_PROVIDER_SITE_OTHER): Payer: BC Managed Care – PPO | Admitting: Physician Assistant

## 2015-05-15 VITALS — BP 138/80 | HR 74 | Temp 97.4°F | Ht 67.0 in | Wt 186.6 lb

## 2015-05-15 DIAGNOSIS — R109 Unspecified abdominal pain: Secondary | ICD-10-CM | POA: Diagnosis not present

## 2015-05-15 LAB — POCT URINALYSIS DIPSTICK
Bilirubin, UA: NEGATIVE
Blood, UA: NEGATIVE
GLUCOSE UA: NEGATIVE
Ketones, UA: NEGATIVE
LEUKOCYTES UA: NEGATIVE
Nitrite, UA: NEGATIVE
PH UA: 6.5
Protein, UA: NEGATIVE
Spec Grav, UA: <=1.005
Urobilinogen, UA: 0.2

## 2015-05-15 MED ORDER — CYCLOBENZAPRINE HCL 10 MG PO TABS
10.0000 mg | ORAL_TABLET | Freq: Every day | ORAL | Status: DC
Start: 2015-05-15 — End: 2016-02-24

## 2015-05-15 MED ORDER — CELECOXIB 100 MG PO CAPS: 100.0000 mg | ORAL_CAPSULE | Freq: Two times a day (BID) | ORAL | Status: DC

## 2015-05-15 NOTE — Assessment & Plan Note (Signed)
Urine dip negative. Will send for micro to assess for crystals. Low suspicion for stone. Symptoms and exam consistent with muscular etiology. Rx Celebrex 100 to take with food. Rx Flexeril at bedtime. Limit gym time over next 4-5 days. Topical Salon Pas, etc to affected region. Follow-up if not resolving or if new symptoms present.

## 2015-05-15 NOTE — Progress Notes (Signed)
Pre visit review using our clinic review tool, if applicable. No additional management support is needed unless otherwise documented below in the visit note. 

## 2015-05-15 NOTE — Progress Notes (Signed)
    Patient presents to clinic today c/o left sided low back pain that has been constant today but worse with movement. Notes pain was worse after workout this morning. Denies nausea/vomiting, urinary symptoms. Denies trauma or injury. Denies radiation of pain. Denies numbness, tingling or weakness of extremities. Has not taken anything for symptoms.  Past Medical History  Diagnosis Date  . Environmental allergies   . Bronchitis   . Thyroid disease     Current Outpatient Prescriptions on File Prior to Visit  Medication Sig Dispense Refill  . cetirizine (ZYRTEC) 10 MG tablet As directed    . FLUoxetine (PROZAC) 20 MG tablet TAKE 1 TABLET BY MOUTH EVERY DAY 30 tablet 11  . levothyroxine (SYNTHROID, LEVOTHROID) 75 MCG tablet TAKE 1 TABLET(75 MCG) BY MOUTH DAILY 30 tablet 11  . multivitamin (THERAGRAN) per tablet      . omeprazole (PRILOSEC) 40 MG capsule Take 1 capsule (40 mg total) by mouth daily. 90 capsule 3  . pneumococcal 13-valent conjugate vaccine (PREVNAR 13) SUSP injection Inject 0.5 mLs into the muscle tomorrow at 10 am. 0.5 mL 0   No current facility-administered medications on file prior to visit.    Allergies  Allergen Reactions  . Penicillins     Family History  Problem Relation Age of Onset  . Stroke Father 87  . Heart disease Brother 67    MI    History   Social History  . Marital Status: Single    Spouse Name: N/A  . Number of Children: N/A  . Years of Education: N/A   Occupational History  . EC teacher , [employer]    Social History Main Topics  . Smoking status: Never Smoker   . Smokeless tobacco: Never Used  . Alcohol Use: 0.0 oz/week    0 Standard drinks or equivalent per week     Comment: Occ  . Drug Use: No  . Sexual Activity:    Partners: Male   Other Topics Concern  . None   Social History Narrative   Exercise-- 3x a week,  Walking, weights, ellipitical, bike          Review of Systems - See HPI.  All other ROS are negative.  BP 138/80  mmHg  Pulse 74  Temp(Src) 97.4 F (36.3 C) (Oral)  Ht 5\' 7"  (1.702 m)  Wt 186 lb 9.6 oz (84.641 kg)  BMI 29.22 kg/m2  SpO2 98%  Physical Exam  Constitutional: She is well-developed, well-nourished, and in no distress.  HENT:  Head: Normocephalic and atraumatic.  Neck: Neck supple.  Cardiovascular: Normal rate, regular rhythm, normal heart sounds and intact distal pulses.   Pulmonary/Chest: Effort normal and breath sounds normal. No respiratory distress. She has no wheezes. She has no rales. She exhibits no tenderness.  Abdominal: There is no CVA tenderness.  Musculoskeletal:       Lumbar back: She exhibits tenderness, pain and spasm. She exhibits no bony tenderness.  Skin: Skin is warm and dry. No rash noted.  Psychiatric: Affect normal.  Vitals reviewed.    Assessment/Plan: Flank pain Urine dip negative. Will send for micro to assess for crystals. Low suspicion for stone. Symptoms and exam consistent with muscular etiology. Rx Celebrex 100 to take with food. Rx Flexeril at bedtime. Limit gym time over next 4-5 days. Topical Salon Pas, etc to affected region. Follow-up if not resolving or if new symptoms present.

## 2015-05-15 NOTE — Patient Instructions (Signed)
Please take Flexeril at bedtime. Use Celebrex once to twice daily as directed if needed for pain. Take with food. Apply topical Icy Hot, Aspercreme or Salon Pas to the affected area. You can also apply a heating pad for 10 minutes, 2-3 times per day. Limit gym time over the next 4-5 days.  Call or return to clinic if symptoms are not resolving.

## 2015-05-16 LAB — URINALYSIS, MICROSCOPIC ONLY
RBC / HPF: NONE SEEN (ref 0–?)
WBC, UA: NONE SEEN (ref 0–?)

## 2015-05-27 ENCOUNTER — Other Ambulatory Visit: Payer: Self-pay | Admitting: Physician Assistant

## 2015-05-27 NOTE — Telephone Encounter (Signed)
Rx given for flank pain, please advise if refills are appropriate.     KP

## 2015-05-27 NOTE — Telephone Encounter (Signed)
Saw for an acute. Refills of Celebrex to be at discretion of PCP.

## 2015-07-08 ENCOUNTER — Ambulatory Visit: Payer: BC Managed Care – PPO | Admitting: Family Medicine

## 2015-08-07 ENCOUNTER — Telehealth: Payer: Self-pay | Admitting: Family Medicine

## 2015-08-07 NOTE — Telephone Encounter (Signed)
Relation to pt: self  Call back number:[phone]   Reason for call:   Patient is retiring and would like her pap done only before 08/20/15 please advise 15 minute appointment. Please advise

## 2015-08-08 NOTE — Telephone Encounter (Signed)
Ok to schedule the pap in a 15 min apt.      KP

## 2015-08-08 NOTE — Telephone Encounter (Signed)
lvm advising patient of message below °

## 2015-10-19 ENCOUNTER — Other Ambulatory Visit: Payer: Self-pay | Admitting: Family Medicine

## 2015-11-01 ENCOUNTER — Telehealth: Payer: Self-pay | Admitting: Family Medicine

## 2015-11-01 NOTE — Telephone Encounter (Signed)
LM to schedule AWV with RN ° °

## 2015-11-07 NOTE — Telephone Encounter (Signed)
trad mcr, wants with Dr. Etter Sjogren

## 2015-12-18 ENCOUNTER — Other Ambulatory Visit: Payer: Self-pay | Admitting: Family Medicine

## 2016-01-15 ENCOUNTER — Other Ambulatory Visit: Payer: Self-pay | Admitting: Family Medicine

## 2016-02-22 ENCOUNTER — Other Ambulatory Visit: Payer: Self-pay | Admitting: Family Medicine

## 2016-02-24 ENCOUNTER — Encounter: Payer: Self-pay | Admitting: Family Medicine

## 2016-02-24 ENCOUNTER — Ambulatory Visit (INDEPENDENT_AMBULATORY_CARE_PROVIDER_SITE_OTHER): Payer: Medicare Other | Admitting: Family Medicine

## 2016-02-24 VITALS — BP 120/80 | HR 74 | Temp 97.7°F | Ht 67.0 in | Wt 186.4 lb

## 2016-02-24 DIAGNOSIS — Z0000 Encounter for general adult medical examination without abnormal findings: Secondary | ICD-10-CM

## 2016-02-24 DIAGNOSIS — F329 Major depressive disorder, single episode, unspecified: Secondary | ICD-10-CM | POA: Diagnosis not present

## 2016-02-24 DIAGNOSIS — F32A Depression, unspecified: Secondary | ICD-10-CM

## 2016-02-24 DIAGNOSIS — E039 Hypothyroidism, unspecified: Secondary | ICD-10-CM

## 2016-02-24 DIAGNOSIS — Z23 Encounter for immunization: Secondary | ICD-10-CM | POA: Diagnosis not present

## 2016-02-24 MED ORDER — LEVOTHYROXINE SODIUM 75 MCG PO TABS: ORAL_TABLET | ORAL | Status: DC

## 2016-02-24 MED ORDER — VENLAFAXINE HCL ER 37.5 MG PO CP24: 37.5000 mg | ORAL_CAPSULE | Freq: Every day | ORAL | Status: DC

## 2016-02-24 MED ORDER — FLUOXETINE HCL 20 MG PO TABS: 20.0000 mg | ORAL_TABLET | Freq: Every day | ORAL | Status: DC

## 2016-02-24 NOTE — Progress Notes (Signed)
Subjective:   Denise Lowery is a 71 y.o. female who presents for an Initial Medicare Annual Wellness Visit.  Review of Systems     Review of Systems  Constitutional: Negative for activity change, appetite change and fatigue.  HENT: Negative for hearing loss, congestion, tinnitus and ear discharge.   Eyes: Negative for visual disturbance (see optho q1y -- vision corrected to 20/20 with glasses).  Respiratory: Negative for cough, chest tightness and shortness of breath.   Cardiovascular: Negative for chest pain, palpitations and leg swelling.  Gastrointestinal: Negative for abdominal pain, diarrhea, constipation and abdominal distention.  Genitourinary: Negative for urgency, frequency, decreased urine volume and difficulty urinating.  Musculoskeletal: Negative for back pain, arthralgias and gait problem.  Skin: Negative for color change, pallor and rash.  Neurological: Negative for dizziness, light-headedness, numbness and headaches.  Hematological: Negative for adenopathy. Does not bruise/bleed easily.  Psychiatric/Behavioral: Negative for suicidal ideas, confusion, sleep disturbance, self-injury, dysphoric mood, decreased concentration and agitation.  Pt is able to read and write and can do all ADLs No risk for falling No abuse/ violence in home     Cardiac Risk Factors include: advanced age (>24men, >65 women)     Objective:    Today's Vitals   02/24/16 1549  BP: 120/80  Pulse: 74  Temp: 97.7 F (36.5 C)  TempSrc: Oral  Height: 5\' 7"  (1.702 m)  Weight: 186 lb 6.4 oz (84.55 kg)  SpO2: 98%   Body mass index is 29.19 kg/(m^2).  BP 120/80 mmHg  Pulse 74  Temp(Src) 97.7 F (36.5 C) (Oral)  Ht 5\' 7"  (1.702 m)  Wt 186 lb 6.4 oz (84.55 kg)  BMI 29.19 kg/m2  SpO2 98% General appearance: alert, cooperative, appears stated age and no distress Head: Normocephalic, without obvious abnormality, atraumatic Eyes: conjunctivae/corneas clear. PERRL, EOM's intact. Fundi  benign. Ears: normal TM's and external ear canals both ears Nose: Nares normal. Septum midline. Mucosa normal. No drainage or sinus tenderness. Throat: lips, mucosa, and tongue normal; teeth and gums normal Neck: no adenopathy, no carotid bruit, no JVD, supple, symmetrical, trachea midline and thyroid not enlarged, symmetric, no tenderness/mass/nodules Back: symmetric, no curvature. ROM normal. No CVA tenderness. Lungs: clear to auscultation bilaterally Breasts: normal appearance, no masses or tenderness Heart: regular rate and rhythm, S1, S2 normal, no murmur, click, rub or gallop Abdomen: soft, non-tender; bowel sounds normal; no masses,  no organomegaly Pelvic: not indicated; post-menopausal, no abnormal Pap smears in past Extremities: extremities normal, atraumatic, no cyanosis or edema Pulses: 2+ and symmetric Skin: Skin color, texture, turgor normal. No rashes or lesions Lymph nodes: Cervical, supraclavicular, and axillary nodes normal. Neurologic: Alert and oriented X 3, normal strength and tone. Normal symmetric reflexes. Normal coordination and gait  Current Medications (verified) Outpatient Encounter Prescriptions as of 02/24/2016  Medication Sig  . cetirizine (ZYRTEC) 10 MG tablet As directed  . FLUoxetine (PROZAC) 20 MG tablet TAKE 1 TABLET BY MOUTH EVERY DAY  . levothyroxine (SYNTHROID, LEVOTHROID) 75 MCG tablet TAKE 1 TABLET(75 MCG) BY MOUTH DAILY  . venlafaxine XR (EFFEXOR-XR) 75 MG 24 hr capsule TAKE 3 CAPSULES BY MOUTH DAILY  . clindamycin (CLEOCIN) 300 MG capsule Reported on 02/24/2016  . [DISCONTINUED] celecoxib (CELEBREX) 100 MG capsule TAKE 1 CAPSULE(100 MG) BY MOUTH TWICE DAILY  . [DISCONTINUED] cyclobenzaprine (FLEXERIL) 10 MG tablet Take 1 tablet (10 mg total) by mouth at bedtime.  . [DISCONTINUED] multivitamin (THERAGRAN) per tablet    . [DISCONTINUED] omeprazole (PRILOSEC) 40 MG capsule TAKE 1  CAPSULE BY MOUTH DAILY  . [DISCONTINUED] pneumococcal 13-valent conjugate  vaccine (PREVNAR 13) SUSP injection Inject 0.5 mLs into the muscle tomorrow at 10 am.   No facility-administered encounter medications on file as of 02/24/2016.    Allergies (verified) Penicillins   History: Past Medical History  Diagnosis Date  . Environmental allergies   . Bronchitis   . Thyroid disease    Past Surgical History  Procedure Laterality Date  . Tonsillectomy  1998  . Appendectomy  1972  . Knee surgery      RIGHT   Family History  Problem Relation Age of Onset  . Stroke Father 25  . Heart disease Brother 29    MI  . Heart disease Cousin 74    MI   Social History   Occupational History  . EC teacher , [employer]    Social History Main Topics  . Smoking status: Never Smoker   . Smokeless tobacco: Never Used  . Alcohol Use: 0.0 oz/week    0 Standard drinks or equivalent per week     Comment: Occ  . Drug Use: No  . Sexual Activity:    Partners: Male    Tobacco Counseling Counseling given: Not Answered   Activities of Daily Living In your present state of health, do you have any difficulty performing the following activities: 02/24/2016 02/24/2016  Hearing? N N  Vision? N N  Difficulty concentrating or making decisions? N Y  Walking or climbing stairs? N N  Dressing or bathing? N N  Doing errands, shopping? N N  Preparing Food and eating ? N -  Using the Toilet? N -  In the past six months, have you accidently leaked urine? N -  Do you have problems with loss of bowel control? N -  Managing your Medications? N -  Managing your Finances? N -  Housekeeping or managing your Housekeeping? N -    Immunizations and Health Maintenance Immunization History  Administered Date(s) Administered  . Influenza-Unspecified 08/19/2014  . Pneumococcal Polysaccharide-23 12/25/2013  . Tdap 10/20/1999, 07/26/2012   Health Maintenance Due  Topic Date Due  . Hepatitis C Screening  [birth year]  . COLONOSCOPY  08/18/1997  . ZOSTAVAX  08/19/2007  . PNA vac Low Risk  Adult (2 of 2 - PCV13) 12/26/2014    Patient Care Team: Ann Held, DO as PCP - General (Family Medicine) Shirl Harris, OD as Consulting Physician (Optometry)  Indicate any recent Medical Services you may have received from other than [facility] providers in the past year (date may be approximate).     Assessment:   This is a routine wellness examination for Denise Lowery.   Hearing/Vision screen Hearing Screening Comments: Whisper normal Vision Screening Comments: Per optometry  Dietary issues and exercise activities discussed: Current Exercise Habits: Home exercise routine, Type of exercise: strength training/weights;walking, Time (Minutes): 40, Frequency (Times/Week): 5, Weekly Exercise (Minutes/Week): 200, Intensity: Moderate, Exercise limited by: None identified  Goals    None     Depression Screen PHQ 2/9 Scores 02/24/2016 02/24/2016 01/08/2015 12/25/2013  PHQ - 2 Score 0 0 1 1    Fall Risk Fall Risk  02/24/2016 02/24/2016 01/08/2015 12/25/2013  Falls in the past year? No No Yes No  Number falls in past yr: - - 1 -  Injury with Fall? - - No -  Follow up - - (No Data) -    Cognitive Function: mmse 30/30  Screening Tests Health Maintenance  Topic Date Due  . Hepatitis  C Screening  [birth year]  . COLONOSCOPY  08/18/1997  . ZOSTAVAX  08/19/2007  . PNA vac Low Risk Adult (2 of 2 - PCV13) 12/26/2014  . INFLUENZA VACCINE  05/19/2016  . MAMMOGRAM  01/21/2017  . TETANUS/TDAP  07/26/2022  . DEXA SCAN  Completed      Plan:    see avs  During the course of the visit, Denise Lowery was educated and counseled about the following appropriate screening and preventive services:   Vaccines to include Pneumoccal, Influenza, Hepatitis B, Td, Zostavax, HCV  Electrocardiogram  Cardiovascular disease screening  Colorectal cancer screening  Bone density screening  Diabetes screening  Glaucoma screening  Mammography/PAP  Nutrition counseling  Smoking cessation counseling  Patient  Instructions (the written plan) were given to the patient.  1. Medicare annual wellness visit, initial See above  2. Depression   - FLUoxetine (PROZAC) 20 MG tablet; Take 1 tablet (20 mg total) by mouth daily.  Dispense: 30 tablet; Refill: 1 - venlafaxine XR (EFFEXOR XR) 37.5 MG 24 hr capsule; Take 1 capsule (37.5 mg total) by mouth daily with breakfast.  Dispense: 30 capsule; Refill: 1  3. Hypothyroidism, unspecified hypothyroidism type   - levothyroxine (SYNTHROID, LEVOTHROID) 75 MCG tablet; TAKE 1 TABLET(75 MCG) BY MOUTH DAILY  Dispense: 30 tablet; Refill: 11  4. Routine history and physical examination of adult    5. Need for pneumococcal vaccination   - Pneumococcal conjugate vaccine 13-valent   Ann Held, DO   02/24/2016

## 2016-02-24 NOTE — Patient Instructions (Addendum)
Preventive Care for Adults, Female A healthy lifestyle and preventive care can promote health and wellness. Preventive health guidelines for women include the following key practices.  A routine yearly physical is a good way to check with your health care provider about your health and preventive screening. It is a chance to share any concerns and updates on your health and to receive a thorough exam.  Visit your dentist for a routine exam and preventive care every 6 months. Brush your teeth twice a day and floss once a day. Good oral hygiene prevents tooth decay and gum disease.  The frequency of eye exams is based on your age, health, family medical history, use of contact lenses, and other factors. Follow your health care provider's recommendations for frequency of eye exams.  Eat a healthy diet. Foods like vegetables, fruits, whole grains, low-fat dairy products, and lean protein foods contain the nutrients you need without too many calories. Decrease your intake of foods high in solid fats, added sugars, and salt. Eat the right amount of calories for you.Get information about a proper diet from your health care provider, if necessary.  Regular physical exercise is one of the most important things you can do for your health. Most adults should get at least 150 minutes of moderate-intensity exercise (any activity that increases your heart rate and causes you to sweat) each week. In addition, most adults need muscle-strengthening exercises on 2 or more days a week.  Maintain a healthy weight. The body mass index (BMI) is a screening tool to identify possible weight problems. It provides an estimate of body fat based on height and weight. Your health care provider can find your BMI and can help you achieve or maintain a healthy weight.For adults 20 years and older:  A BMI below 18.5 is considered underweight.  A BMI of 18.5 to 24.9 is normal.  A BMI of 25 to 29.9 is considered overweight.  A  BMI of 30 and above is considered obese.  Maintain normal blood lipids and cholesterol levels by exercising and minimizing your intake of saturated fat. Eat a balanced diet with plenty of fruit and vegetables. Blood tests for lipids and cholesterol should begin at age 45 and be repeated every 5 years. If your lipid or cholesterol levels are high, you are over 50, or you are at high risk for heart disease, you may need your cholesterol levels checked more frequently.Ongoing high lipid and cholesterol levels should be treated with medicines if diet and exercise are not working.  If you smoke, find out from your health care provider how to quit. If you do not use tobacco, do not start.  Lung cancer screening is recommended for adults aged 45-80 years who are at high risk for developing lung cancer because of a history of smoking. A yearly low-dose CT scan of the lungs is recommended for people who have at least a 30-pack-year history of smoking and are a current smoker or have quit within the past 15 years. A pack year of smoking is smoking an average of 1 pack of cigarettes a day for 1 year (for example: 1 pack a day for 30 years or 2 packs a day for 15 years). Yearly screening should continue until the smoker has stopped smoking for at least 15 years. Yearly screening should be stopped for people who develop a health problem that would prevent them from having lung cancer treatment.  If you are pregnant, do not drink alcohol. If you are  breastfeeding, be very cautious about drinking alcohol. If you are not pregnant and choose to drink alcohol, do not have more than 1 drink per day. One drink is considered to be 12 ounces (355 mL) of beer, 5 ounces (148 mL) of wine, or 1.5 ounces (44 mL) of liquor.  Avoid use of street drugs. Do not share needles with anyone. Ask for help if you need support or instructions about stopping the use of drugs.  High blood pressure causes heart disease and increases the risk  of stroke. Your blood pressure should be checked at least every 1 to 2 years. Ongoing high blood pressure should be treated with medicines if weight loss and exercise do not work.  If you are 55-79 years old, ask your health care provider if you should take aspirin to prevent strokes.  Diabetes screening is done by taking a blood sample to check your blood glucose level after you have not eaten for a certain period of time (fasting). If you are not overweight and you do not have risk factors for diabetes, you should be screened once every 3 years starting at age 45. If you are overweight or obese and you are 40-70 years of age, you should be screened for diabetes every year as part of your cardiovascular risk assessment.  Breast cancer screening is essential preventive care for women. You should practice "breast self-awareness." This means understanding the normal appearance and feel of your breasts and may include breast self-examination. Any changes detected, no matter how small, should be reported to a health care provider. Women in their 20s and 30s should have a clinical breast exam (CBE) by a health care provider as part of a regular health exam every 1 to 3 years. After age 40, women should have a CBE every year. Starting at age 40, women should consider having a mammogram (breast X-ray test) every year. Women who have a family history of breast cancer should talk to their health care provider about genetic screening. Women at a high risk of breast cancer should talk to their health care providers about having an MRI and a mammogram every year.  Breast cancer gene (BRCA)-related cancer risk assessment is recommended for women who have family members with BRCA-related cancers. BRCA-related cancers include breast, ovarian, tubal, and peritoneal cancers. Having family members with these cancers may be associated with an increased risk for harmful changes (mutations) in the breast cancer genes BRCA1 and  BRCA2. Results of the assessment will determine the need for genetic counseling and BRCA1 and BRCA2 testing.  Your health care provider may recommend that you be screened regularly for cancer of the pelvic organs (ovaries, uterus, and vagina). This screening involves a pelvic examination, including checking for microscopic changes to the surface of your cervix (Pap test). You may be encouraged to have this screening done every 3 years, beginning at age 21.  For women ages 30-65, health care providers may recommend pelvic exams and Pap testing every 3 years, or they may recommend the Pap and pelvic exam, combined with testing for human papilloma virus (HPV), every 5 years. Some types of HPV increase your risk of cervical cancer. Testing for HPV may also be done on women of any age with unclear Pap test results.  Other health care providers may not recommend any screening for nonpregnant women who are considered low risk for pelvic cancer and who do not have symptoms. Ask your health care provider if a screening pelvic exam is right for   you.  If you have had past treatment for cervical cancer or a condition that could lead to cancer, you need Pap tests and screening for cancer for at least 20 years after your treatment. If Pap tests have been discontinued, your risk factors (such as having a new sexual partner) need to be reassessed to determine if screening should resume. Some women have medical problems that increase the chance of getting cervical cancer. In these cases, your health care provider may recommend more frequent screening and Pap tests.  Colorectal cancer can be detected and often prevented. Most routine colorectal cancer screening begins at the age of 50 years and continues through age 75 years. However, your health care provider may recommend screening at an earlier age if you have risk factors for colon cancer. On a yearly basis, your health care provider may provide home test kits to check  for hidden blood in the stool. Use of a small camera at the end of a tube, to directly examine the colon (sigmoidoscopy or colonoscopy), can detect the earliest forms of colorectal cancer. Talk to your health care provider about this at age 50, when routine screening begins. Direct exam of the colon should be repeated every 5-10 years through age 75 years, unless early forms of precancerous polyps or small growths are found.  People who are at an increased risk for hepatitis B should be screened for this virus. You are considered at high risk for hepatitis B if:  You were born in a country where hepatitis B occurs often. Talk with your health care provider about which countries are considered high risk.  Your parents were born in a high-risk country and you have not received a shot to protect against hepatitis B (hepatitis B vaccine).  You have HIV or AIDS.  You use needles to inject street drugs.  You live with, or have sex with, someone who has hepatitis B.  You get hemodialysis treatment.  You take certain medicines for conditions like cancer, organ transplantation, and autoimmune conditions.  Hepatitis C blood testing is recommended for all people born from 1945 through 1965 and any individual with known risks for hepatitis C.  Practice safe sex. Use condoms and avoid high-risk sexual practices to reduce the spread of sexually transmitted infections (STIs). STIs include gonorrhea, chlamydia, syphilis, trichomonas, herpes, HPV, and human immunodeficiency virus (HIV). Herpes, HIV, and HPV are viral illnesses that have no cure. They can result in disability, cancer, and death.  You should be screened for sexually transmitted illnesses (STIs) including gonorrhea and chlamydia if:  You are sexually active and are younger than 24 years.  You are older than 24 years and your health care provider tells you that you are at risk for this type of infection.  Your sexual activity has changed  since you were last screened and you are at an increased risk for chlamydia or gonorrhea. Ask your health care provider if you are at risk.  If you are at risk of being infected with HIV, it is recommended that you take a prescription medicine daily to prevent HIV infection. This is called preexposure prophylaxis (PrEP). You are considered at risk if:  You are sexually active and do not regularly use condoms or know the HIV status of your partner(s).  You take drugs by injection.  You are sexually active with a partner who has HIV.  Talk with your health care provider about whether you are at high risk of being infected with HIV. If   you choose to begin PrEP, you should first be tested for HIV. You should then be tested every 3 months for as long as you are taking PrEP.  Osteoporosis is a disease in which the bones lose minerals and strength with aging. This can result in serious bone fractures or breaks. The risk of osteoporosis can be identified using a bone density scan. Women ages 67 years and over and women at risk for fractures or osteoporosis should discuss screening with their health care providers. Ask your health care provider whether you should take a calcium supplement or vitamin D to reduce the rate of osteoporosis.  Menopause can be associated with physical symptoms and risks. Hormone replacement therapy is available to decrease symptoms and risks. You should talk to your health care provider about whether hormone replacement therapy is right for you.  Use sunscreen. Apply sunscreen liberally and repeatedly throughout the day. You should seek shade when your shadow is shorter than you. Protect yourself by wearing long sleeves, pants, a wide-brimmed hat, and sunglasses year round, whenever you are outdoors.  Once a month, do a whole body skin exam, using a mirror to look at the skin on your back. Tell your health care provider of new moles, moles that have irregular borders, moles that  are larger than a pencil eraser, or moles that have changed in shape or color.  Stay current with required vaccines (immunizations).  Influenza vaccine. All adults should be immunized every year.  Tetanus, diphtheria, and acellular pertussis (Td, Tdap) vaccine. Pregnant women should receive 1 dose of Tdap vaccine during each pregnancy. The dose should be obtained regardless of the length of time since the last dose. Immunization is preferred during the 27th-36th week of gestation. An adult who has not previously received Tdap or who does not know her vaccine status should receive 1 dose of Tdap. This initial dose should be followed by tetanus and diphtheria toxoids (Td) booster doses every 10 years. Adults with an unknown or incomplete history of completing a 3-dose immunization series with Td-containing vaccines should begin or complete a primary immunization series including a Tdap dose. Adults should receive a Td booster every 10 years.  Varicella vaccine. An adult without evidence of immunity to varicella should receive 2 doses or a second dose if she has previously received 1 dose. Pregnant females who do not have evidence of immunity should receive the first dose after pregnancy. This first dose should be obtained before leaving the health care facility. The second dose should be obtained 4-8 weeks after the first dose.  Human papillomavirus (HPV) vaccine. Females aged 13-26 years who have not received the vaccine previously should obtain the 3-dose series. The vaccine is not recommended for use in pregnant females. However, pregnancy testing is not needed before receiving a dose. If a female is found to be pregnant after receiving a dose, no treatment is needed. In that case, the remaining doses should be delayed until after the pregnancy. Immunization is recommended for any person with an immunocompromised condition through the age of 61 years if she did not get any or all doses earlier. During the  3-dose series, the second dose should be obtained 4-8 weeks after the first dose. The third dose should be obtained 24 weeks after the first dose and 16 weeks after the second dose.  Zoster vaccine. One dose is recommended for adults aged 30 years or older unless certain conditions are present.  Measles, mumps, and rubella (MMR) vaccine. Adults born  before 1957 generally are considered immune to measles and mumps. Adults born in 1957 or later should have 1 or more doses of MMR vaccine unless there is a contraindication to the vaccine or there is laboratory evidence of immunity to each of the three diseases. A routine second dose of MMR vaccine should be obtained at least 28 days after the first dose for students attending postsecondary schools, health care workers, or international travelers. People who received inactivated measles vaccine or an unknown type of measles vaccine during 1963-1967 should receive 2 doses of MMR vaccine. People who received inactivated mumps vaccine or an unknown type of mumps vaccine before 1979 and are at high risk for mumps infection should consider immunization with 2 doses of MMR vaccine. For females of childbearing age, rubella immunity should be determined. If there is no evidence of immunity, females who are not pregnant should be vaccinated. If there is no evidence of immunity, females who are pregnant should delay immunization until after pregnancy. Unvaccinated health care workers born before 1957 who lack laboratory evidence of measles, mumps, or rubella immunity or laboratory confirmation of disease should consider measles and mumps immunization with 2 doses of MMR vaccine or rubella immunization with 1 dose of MMR vaccine.  Pneumococcal 13-valent conjugate (PCV13) vaccine. When indicated, a person who is uncertain of his immunization history and has no record of immunization should receive the PCV13 vaccine. All adults 65 years of age and older should receive this  vaccine. An adult aged 19 years or older who has certain medical conditions and has not been previously immunized should receive 1 dose of PCV13 vaccine. This PCV13 should be followed with a dose of pneumococcal polysaccharide (PPSV23) vaccine. Adults who are at high risk for pneumococcal disease should obtain the PPSV23 vaccine at least 8 weeks after the dose of PCV13 vaccine. Adults older than 69 years of age who have normal immune system function should obtain the PPSV23 vaccine dose at least 1 year after the dose of PCV13 vaccine.  Pneumococcal polysaccharide (PPSV23) vaccine. When PCV13 is also indicated, PCV13 should be obtained first. All adults aged 65 years and older should be immunized. An adult younger than age 65 years who has certain medical conditions should be immunized. Any person who resides in a nursing home or long-term care facility should be immunized. An adult smoker should be immunized. People with an immunocompromised condition and certain other conditions should receive both PCV13 and PPSV23 vaccines. People with human immunodeficiency virus (HIV) infection should be immunized as soon as possible after diagnosis. Immunization during chemotherapy or radiation therapy should be avoided. Routine use of PPSV23 vaccine is not recommended for American Indians, [place] Natives, or people younger than 65 years unless there are medical conditions that require PPSV23 vaccine. When indicated, people who have unknown immunization and have no record of immunization should receive PPSV23 vaccine. One-time revaccination 5 years after the first dose of PPSV23 is recommended for people aged 19-64 years who have chronic kidney failure, nephrotic syndrome, asplenia, or immunocompromised conditions. People who received 1-2 doses of PPSV23 before age 65 years should receive another dose of PPSV23 vaccine at age 65 years or later if at least 5 years have passed since the previous dose. Doses of PPSV23 are not  needed for people immunized with PPSV23 at or after age 65 years.  Meningococcal vaccine. Adults with asplenia or persistent complement component deficiencies should receive 2 doses of quadrivalent meningococcal conjugate (MenACWY-D) vaccine. The doses should be obtained   at least 2 months apart. Microbiologists working with certain meningococcal bacteria, [place] recruits, people at risk during an outbreak, and people who travel to or live in countries with a high rate of meningitis should be immunized. A first-year college student up through age 34 years who is living in a residence hall should receive a dose if she did not receive a dose on or after her 16th birthday. Adults who have certain high-risk conditions should receive one or more doses of vaccine.  Hepatitis A vaccine. Adults who wish to be protected from this disease, have certain high-risk conditions, work with hepatitis A-infected animals, work in hepatitis A research labs, or travel to or work in countries with a high rate of hepatitis A should be immunized. Adults who were previously unvaccinated and who anticipate close contact with an international adoptee during the first 60 days after arrival in the [place] from a country with a high rate of hepatitis A should be immunized.  Hepatitis B vaccine. Adults who wish to be protected from this disease, have certain high-risk conditions, may be exposed to blood or other infectious body fluids, are household contacts or sex partners of hepatitis B positive people, are clients or workers in certain care facilities, or travel to or work in countries with a high rate of hepatitis B should be immunized.  Haemophilus influenzae type b (Hib) vaccine. A previously unvaccinated person with asplenia or sickle cell disease or having a scheduled splenectomy should receive 1 dose of Hib vaccine. Regardless of previous immunization, a recipient of a hematopoietic stem cell transplant should receive a  3-dose series 6-12 months after her successful transplant. Hib vaccine is not recommended for adults with HIV infection. Preventive Services / Frequency Ages 35 to 4 years  Blood pressure check.** / Every 3-5 years.  Lipid and cholesterol check.** / Every 5 years beginning at age 60.  Clinical breast exam.** / Every 3 years for women in their 71s and 10s.  BRCA-related cancer risk assessment.** / For women who have family members with a BRCA-related cancer (breast, ovarian, tubal, or peritoneal cancers).  Pap test.** / Every 2 years from ages 76 through 26. Every 3 years starting at age 61 through age 76 or 93 with a history of 3 consecutive normal Pap tests.  HPV screening.** / Every 3 years from ages 37 through ages 60 to 51 with a history of 3 consecutive normal Pap tests.  Hepatitis C blood test.** / For any individual with known risks for hepatitis C.  Skin self-exam. / Monthly.  Influenza vaccine. / Every year.  Tetanus, diphtheria, and acellular pertussis (Tdap, Td) vaccine.** / Consult your health care provider. Pregnant women should receive 1 dose of Tdap vaccine during each pregnancy. 1 dose of Td every 10 years.  Varicella vaccine.** / Consult your health care provider. Pregnant females who do not have evidence of immunity should receive the first dose after pregnancy.  HPV vaccine. / 3 doses over 6 months, if 93 and younger. The vaccine is not recommended for use in pregnant females. However, pregnancy testing is not needed before receiving a dose.  Measles, mumps, rubella (MMR) vaccine.** / You need at least 1 dose of MMR if you were born in 1957 or later. You may also need a 2nd dose. For females of childbearing age, rubella immunity should be determined. If there is no evidence of immunity, females who are not pregnant should be vaccinated. If there is no evidence of immunity, females who are  pregnant should delay immunization until after pregnancy.  Pneumococcal  13-valent conjugate (PCV13) vaccine.** / Consult your health care provider.  Pneumococcal polysaccharide (PPSV23) vaccine.** / 1 to 2 doses if you smoke cigarettes or if you have certain conditions.  Meningococcal vaccine.** / 1 dose if you are age 68 to 8 years and a Market researcher living in a residence hall, or have one of several medical conditions, you need to get vaccinated against meningococcal disease. You may also need additional booster doses.  Hepatitis A vaccine.** / Consult your health care provider.  Hepatitis B vaccine.** / Consult your health care provider.  Haemophilus influenzae type b (Hib) vaccine.** / Consult your health care provider. Ages 7 to 53 years  Blood pressure check.** / Every year.  Lipid and cholesterol check.** / Every 5 years beginning at age 25 years.  Lung cancer screening. / Every year if you are aged 11-80 years and have a 30-pack-year history of smoking and currently smoke or have quit within the past 15 years. Yearly screening is stopped once you have quit smoking for at least 15 years or develop a health problem that would prevent you from having lung cancer treatment.  Clinical breast exam.** / Every year after age 48 years.  BRCA-related cancer risk assessment.** / For women who have family members with a BRCA-related cancer (breast, ovarian, tubal, or peritoneal cancers).  Mammogram.** / Every year beginning at age 41 years and continuing for as long as you are in good health. Consult with your health care provider.  Pap test.** / Every 3 years starting at age 65 years through age 37 or 70 years with a history of 3 consecutive normal Pap tests.  HPV screening.** / Every 3 years from ages 72 years through ages 60 to 40 years with a history of 3 consecutive normal Pap tests.  Fecal occult blood test (FOBT) of stool. / Every year beginning at age 21 years and continuing until age 5 years. You may not need to do this test if you get  a colonoscopy every 10 years.  Flexible sigmoidoscopy or colonoscopy.** / Every 5 years for a flexible sigmoidoscopy or every 10 years for a colonoscopy beginning at age 35 years and continuing until age 48 years.  Hepatitis C blood test.** / For all people born from [birth year] through 1965 and any individual with known risks for hepatitis C.  Skin self-exam. / Monthly.  Influenza vaccine. / Every year.  Tetanus, diphtheria, and acellular pertussis (Tdap/Td) vaccine.** / Consult your health care provider. Pregnant women should receive 1 dose of Tdap vaccine during each pregnancy. 1 dose of Td every 10 years.  Varicella vaccine.** / Consult your health care provider. Pregnant females who do not have evidence of immunity should receive the first dose after pregnancy.  Zoster vaccine.** / 1 dose for adults aged 30 years or older.  Measles, mumps, rubella (MMR) vaccine.** / You need at least 1 dose of MMR if you were born in 1957 or later. You may also need a second dose. For females of childbearing age, rubella immunity should be determined. If there is no evidence of immunity, females who are not pregnant should be vaccinated. If there is no evidence of immunity, females who are pregnant should delay immunization until after pregnancy.  Pneumococcal 13-valent conjugate (PCV13) vaccine.** / Consult your health care provider.  Pneumococcal polysaccharide (PPSV23) vaccine.** / 1 to 2 doses if you smoke cigarettes or if you have certain conditions.  Meningococcal vaccine.** /  Consult your health care provider.  Hepatitis A vaccine.** / Consult your health care provider.  Hepatitis B vaccine.** / Consult your health care provider.  Haemophilus influenzae type b (Hib) vaccine.** / Consult your health care provider. Ages 31 years and over  Blood pressure check.** / Every year.  Lipid and cholesterol check.** / Every 5 years beginning at age 46 years.  Lung cancer screening. / Every year if you  are aged 82-80 years and have a 30-pack-year history of smoking and currently smoke or have quit within the past 15 years. Yearly screening is stopped once you have quit smoking for at least 15 years or develop a health problem that would prevent you from having lung cancer treatment.  Clinical breast exam.** / Every year after age 61 years.  BRCA-related cancer risk assessment.** / For women who have family members with a BRCA-related cancer (breast, ovarian, tubal, or peritoneal cancers).  Mammogram.** / Every year beginning at age 10 years and continuing for as long as you are in good health. Consult with your health care provider.  Pap test.** / Every 3 years starting at age 41 years through age 31 or 33 years with 3 consecutive normal Pap tests. Testing can be stopped between 65 and 70 years with 3 consecutive normal Pap tests and no abnormal Pap or HPV tests in the past 10 years.  HPV screening.** / Every 3 years from ages 45 years through ages 31 or 16 years with a history of 3 consecutive normal Pap tests. Testing can be stopped between 65 and 70 years with 3 consecutive normal Pap tests and no abnormal Pap or HPV tests in the past 10 years.  Fecal occult blood test (FOBT) of stool. / Every year beginning at age 56 years and continuing until age 32 years. You may not need to do this test if you get a colonoscopy every 10 years.  Flexible sigmoidoscopy or colonoscopy.** / Every 5 years for a flexible sigmoidoscopy or every 10 years for a colonoscopy beginning at age 86 years and continuing until age 70 years.  Hepatitis C blood test.** / For all people born from [birth year] through 1964 and any individual with known risks for hepatitis C.  Osteoporosis screening.** / A one-time screening for women ages 74 years and over and women at risk for fractures or osteoporosis.  Skin self-exam. / Monthly.  Influenza vaccine. / Every year.  Tetanus, diphtheria, and acellular pertussis (Tdap/Td)  vaccine.** / 1 dose of Td every 10 years.  Varicella vaccine.** / Consult your health care provider.  Zoster vaccine.** / 1 dose for adults aged 55 years or older.  Pneumococcal 13-valent conjugate (PCV13) vaccine.** / Consult your health care provider.  Pneumococcal polysaccharide (PPSV23) vaccine.** / 1 dose for all adults aged 43 years and older.  Meningococcal vaccine.** / Consult your health care provider.  Hepatitis A vaccine.** / Consult your health care provider.  Hepatitis B vaccine.** / Consult your health care provider.  Haemophilus influenzae type b (Hib) vaccine.** / Consult your health care provider. ** Family history and personal history of risk and conditions may change your health care provider's recommendations.   This information is not intended to replace advice given to you by your health care provider. Make sure you discuss any questions you have with your health care provider.   Document Released: 12/01/2001 Document Revised: 10/26/2014 Document Reviewed: 03/02/2011 Elsevier Interactive Patient Education [employer].

## 2016-03-10 ENCOUNTER — Other Ambulatory Visit: Payer: Self-pay | Admitting: Family Medicine

## 2016-03-10 DIAGNOSIS — M4316 Spondylolisthesis, lumbar region: Secondary | ICD-10-CM | POA: Diagnosis not present

## 2016-03-10 DIAGNOSIS — M5136 Other intervertebral disc degeneration, lumbar region: Secondary | ICD-10-CM | POA: Diagnosis not present

## 2016-03-10 DIAGNOSIS — M4126 Other idiopathic scoliosis, lumbar region: Secondary | ICD-10-CM | POA: Diagnosis not present

## 2016-03-10 DIAGNOSIS — M9904 Segmental and somatic dysfunction of sacral region: Secondary | ICD-10-CM | POA: Diagnosis not present

## 2016-03-10 DIAGNOSIS — M9905 Segmental and somatic dysfunction of pelvic region: Secondary | ICD-10-CM | POA: Diagnosis not present

## 2016-03-10 DIAGNOSIS — Q72812 Congenital shortening of left lower limb: Secondary | ICD-10-CM | POA: Diagnosis not present

## 2016-03-10 DIAGNOSIS — M9903 Segmental and somatic dysfunction of lumbar region: Secondary | ICD-10-CM | POA: Diagnosis not present

## 2016-03-11 DIAGNOSIS — M4126 Other idiopathic scoliosis, lumbar region: Secondary | ICD-10-CM | POA: Diagnosis not present

## 2016-03-11 DIAGNOSIS — Q72812 Congenital shortening of left lower limb: Secondary | ICD-10-CM | POA: Diagnosis not present

## 2016-03-11 DIAGNOSIS — M9903 Segmental and somatic dysfunction of lumbar region: Secondary | ICD-10-CM | POA: Diagnosis not present

## 2016-03-11 DIAGNOSIS — M5136 Other intervertebral disc degeneration, lumbar region: Secondary | ICD-10-CM | POA: Diagnosis not present

## 2016-03-11 DIAGNOSIS — M9904 Segmental and somatic dysfunction of sacral region: Secondary | ICD-10-CM | POA: Diagnosis not present

## 2016-03-11 DIAGNOSIS — M9905 Segmental and somatic dysfunction of pelvic region: Secondary | ICD-10-CM | POA: Diagnosis not present

## 2016-03-11 DIAGNOSIS — M4316 Spondylolisthesis, lumbar region: Secondary | ICD-10-CM | POA: Diagnosis not present

## 2016-03-12 DIAGNOSIS — M9905 Segmental and somatic dysfunction of pelvic region: Secondary | ICD-10-CM | POA: Diagnosis not present

## 2016-03-12 DIAGNOSIS — M4126 Other idiopathic scoliosis, lumbar region: Secondary | ICD-10-CM | POA: Diagnosis not present

## 2016-03-12 DIAGNOSIS — M4316 Spondylolisthesis, lumbar region: Secondary | ICD-10-CM | POA: Diagnosis not present

## 2016-03-12 DIAGNOSIS — M9903 Segmental and somatic dysfunction of lumbar region: Secondary | ICD-10-CM | POA: Diagnosis not present

## 2016-03-12 DIAGNOSIS — Q72812 Congenital shortening of left lower limb: Secondary | ICD-10-CM | POA: Diagnosis not present

## 2016-03-12 DIAGNOSIS — M9904 Segmental and somatic dysfunction of sacral region: Secondary | ICD-10-CM | POA: Diagnosis not present

## 2016-03-12 DIAGNOSIS — M5136 Other intervertebral disc degeneration, lumbar region: Secondary | ICD-10-CM | POA: Diagnosis not present

## 2016-03-17 DIAGNOSIS — M9904 Segmental and somatic dysfunction of sacral region: Secondary | ICD-10-CM | POA: Diagnosis not present

## 2016-03-17 DIAGNOSIS — M9905 Segmental and somatic dysfunction of pelvic region: Secondary | ICD-10-CM | POA: Diagnosis not present

## 2016-03-17 DIAGNOSIS — M5136 Other intervertebral disc degeneration, lumbar region: Secondary | ICD-10-CM | POA: Diagnosis not present

## 2016-03-17 DIAGNOSIS — M9903 Segmental and somatic dysfunction of lumbar region: Secondary | ICD-10-CM | POA: Diagnosis not present

## 2016-03-17 DIAGNOSIS — Q72812 Congenital shortening of left lower limb: Secondary | ICD-10-CM | POA: Diagnosis not present

## 2016-03-17 DIAGNOSIS — M4316 Spondylolisthesis, lumbar region: Secondary | ICD-10-CM | POA: Diagnosis not present

## 2016-03-17 DIAGNOSIS — M4126 Other idiopathic scoliosis, lumbar region: Secondary | ICD-10-CM | POA: Diagnosis not present

## 2016-03-18 DIAGNOSIS — M9903 Segmental and somatic dysfunction of lumbar region: Secondary | ICD-10-CM | POA: Diagnosis not present

## 2016-03-18 DIAGNOSIS — M9905 Segmental and somatic dysfunction of pelvic region: Secondary | ICD-10-CM | POA: Diagnosis not present

## 2016-03-18 DIAGNOSIS — M4316 Spondylolisthesis, lumbar region: Secondary | ICD-10-CM | POA: Diagnosis not present

## 2016-03-18 DIAGNOSIS — M9904 Segmental and somatic dysfunction of sacral region: Secondary | ICD-10-CM | POA: Diagnosis not present

## 2016-03-18 DIAGNOSIS — M4126 Other idiopathic scoliosis, lumbar region: Secondary | ICD-10-CM | POA: Diagnosis not present

## 2016-03-18 DIAGNOSIS — M5136 Other intervertebral disc degeneration, lumbar region: Secondary | ICD-10-CM | POA: Diagnosis not present

## 2016-03-18 DIAGNOSIS — Q72812 Congenital shortening of left lower limb: Secondary | ICD-10-CM | POA: Diagnosis not present

## 2016-03-20 DIAGNOSIS — M9903 Segmental and somatic dysfunction of lumbar region: Secondary | ICD-10-CM | POA: Diagnosis not present

## 2016-03-20 DIAGNOSIS — M9904 Segmental and somatic dysfunction of sacral region: Secondary | ICD-10-CM | POA: Diagnosis not present

## 2016-03-20 DIAGNOSIS — M4316 Spondylolisthesis, lumbar region: Secondary | ICD-10-CM | POA: Diagnosis not present

## 2016-03-20 DIAGNOSIS — Q72812 Congenital shortening of left lower limb: Secondary | ICD-10-CM | POA: Diagnosis not present

## 2016-03-20 DIAGNOSIS — M4126 Other idiopathic scoliosis, lumbar region: Secondary | ICD-10-CM | POA: Diagnosis not present

## 2016-03-20 DIAGNOSIS — M5136 Other intervertebral disc degeneration, lumbar region: Secondary | ICD-10-CM | POA: Diagnosis not present

## 2016-03-20 DIAGNOSIS — M9905 Segmental and somatic dysfunction of pelvic region: Secondary | ICD-10-CM | POA: Diagnosis not present

## 2016-03-23 DIAGNOSIS — Q72812 Congenital shortening of left lower limb: Secondary | ICD-10-CM | POA: Diagnosis not present

## 2016-03-23 DIAGNOSIS — M9904 Segmental and somatic dysfunction of sacral region: Secondary | ICD-10-CM | POA: Diagnosis not present

## 2016-03-23 DIAGNOSIS — M9905 Segmental and somatic dysfunction of pelvic region: Secondary | ICD-10-CM | POA: Diagnosis not present

## 2016-03-23 DIAGNOSIS — M5136 Other intervertebral disc degeneration, lumbar region: Secondary | ICD-10-CM | POA: Diagnosis not present

## 2016-03-23 DIAGNOSIS — M4126 Other idiopathic scoliosis, lumbar region: Secondary | ICD-10-CM | POA: Diagnosis not present

## 2016-03-23 DIAGNOSIS — M9903 Segmental and somatic dysfunction of lumbar region: Secondary | ICD-10-CM | POA: Diagnosis not present

## 2016-03-23 DIAGNOSIS — M4316 Spondylolisthesis, lumbar region: Secondary | ICD-10-CM | POA: Diagnosis not present

## 2016-03-25 ENCOUNTER — Other Ambulatory Visit: Payer: Self-pay | Admitting: Family Medicine

## 2016-03-25 DIAGNOSIS — M9903 Segmental and somatic dysfunction of lumbar region: Secondary | ICD-10-CM | POA: Diagnosis not present

## 2016-03-25 DIAGNOSIS — M4126 Other idiopathic scoliosis, lumbar region: Secondary | ICD-10-CM | POA: Diagnosis not present

## 2016-03-25 DIAGNOSIS — M4316 Spondylolisthesis, lumbar region: Secondary | ICD-10-CM | POA: Diagnosis not present

## 2016-03-25 DIAGNOSIS — M9905 Segmental and somatic dysfunction of pelvic region: Secondary | ICD-10-CM | POA: Diagnosis not present

## 2016-03-25 DIAGNOSIS — M5136 Other intervertebral disc degeneration, lumbar region: Secondary | ICD-10-CM | POA: Diagnosis not present

## 2016-03-25 DIAGNOSIS — Q72812 Congenital shortening of left lower limb: Secondary | ICD-10-CM | POA: Diagnosis not present

## 2016-03-25 DIAGNOSIS — H04123 Dry eye syndrome of bilateral lacrimal glands: Secondary | ICD-10-CM | POA: Diagnosis not present

## 2016-03-25 DIAGNOSIS — M9904 Segmental and somatic dysfunction of sacral region: Secondary | ICD-10-CM | POA: Diagnosis not present

## 2016-03-25 DIAGNOSIS — H25812 Combined forms of age-related cataract, left eye: Secondary | ICD-10-CM | POA: Diagnosis not present

## 2016-03-25 NOTE — Telephone Encounter (Signed)
Dr Carollee Herter-- Venlafaxine 37.5mg  one a day and Venlafaxine 75mg  3 capsules a day are both on current med list. Is pt supposed to be on both strengths? Pharmacy is request rx for 75mg .

## 2016-03-27 DIAGNOSIS — M9903 Segmental and somatic dysfunction of lumbar region: Secondary | ICD-10-CM | POA: Diagnosis not present

## 2016-03-27 DIAGNOSIS — M9904 Segmental and somatic dysfunction of sacral region: Secondary | ICD-10-CM | POA: Diagnosis not present

## 2016-03-27 DIAGNOSIS — Q72812 Congenital shortening of left lower limb: Secondary | ICD-10-CM | POA: Diagnosis not present

## 2016-03-27 DIAGNOSIS — M4126 Other idiopathic scoliosis, lumbar region: Secondary | ICD-10-CM | POA: Diagnosis not present

## 2016-03-27 DIAGNOSIS — M9905 Segmental and somatic dysfunction of pelvic region: Secondary | ICD-10-CM | POA: Diagnosis not present

## 2016-03-27 DIAGNOSIS — M5136 Other intervertebral disc degeneration, lumbar region: Secondary | ICD-10-CM | POA: Diagnosis not present

## 2016-03-27 DIAGNOSIS — M4316 Spondylolisthesis, lumbar region: Secondary | ICD-10-CM | POA: Diagnosis not present

## 2016-04-06 DIAGNOSIS — M9904 Segmental and somatic dysfunction of sacral region: Secondary | ICD-10-CM | POA: Diagnosis not present

## 2016-04-06 DIAGNOSIS — Q72812 Congenital shortening of left lower limb: Secondary | ICD-10-CM | POA: Diagnosis not present

## 2016-04-06 DIAGNOSIS — M4316 Spondylolisthesis, lumbar region: Secondary | ICD-10-CM | POA: Diagnosis not present

## 2016-04-06 DIAGNOSIS — M9905 Segmental and somatic dysfunction of pelvic region: Secondary | ICD-10-CM | POA: Diagnosis not present

## 2016-04-06 DIAGNOSIS — M9903 Segmental and somatic dysfunction of lumbar region: Secondary | ICD-10-CM | POA: Diagnosis not present

## 2016-04-06 DIAGNOSIS — M4126 Other idiopathic scoliosis, lumbar region: Secondary | ICD-10-CM | POA: Diagnosis not present

## 2016-04-06 DIAGNOSIS — M5136 Other intervertebral disc degeneration, lumbar region: Secondary | ICD-10-CM | POA: Diagnosis not present

## 2016-04-08 ENCOUNTER — Other Ambulatory Visit: Payer: Self-pay | Admitting: Family Medicine

## 2016-04-08 NOTE — Telephone Encounter (Signed)
Rx denied-Filled on (02/24/16;#30,11).//AB/CMA

## 2016-04-09 DIAGNOSIS — M5136 Other intervertebral disc degeneration, lumbar region: Secondary | ICD-10-CM | POA: Diagnosis not present

## 2016-04-09 DIAGNOSIS — Q72812 Congenital shortening of left lower limb: Secondary | ICD-10-CM | POA: Diagnosis not present

## 2016-04-09 DIAGNOSIS — M9903 Segmental and somatic dysfunction of lumbar region: Secondary | ICD-10-CM | POA: Diagnosis not present

## 2016-04-09 DIAGNOSIS — M4316 Spondylolisthesis, lumbar region: Secondary | ICD-10-CM | POA: Diagnosis not present

## 2016-04-09 DIAGNOSIS — M4126 Other idiopathic scoliosis, lumbar region: Secondary | ICD-10-CM | POA: Diagnosis not present

## 2016-04-09 DIAGNOSIS — M9904 Segmental and somatic dysfunction of sacral region: Secondary | ICD-10-CM | POA: Diagnosis not present

## 2016-04-09 DIAGNOSIS — M9905 Segmental and somatic dysfunction of pelvic region: Secondary | ICD-10-CM | POA: Diagnosis not present

## 2016-04-11 ENCOUNTER — Other Ambulatory Visit: Payer: Self-pay | Admitting: Family Medicine

## 2016-04-13 DIAGNOSIS — M9905 Segmental and somatic dysfunction of pelvic region: Secondary | ICD-10-CM | POA: Diagnosis not present

## 2016-04-13 DIAGNOSIS — M5136 Other intervertebral disc degeneration, lumbar region: Secondary | ICD-10-CM | POA: Diagnosis not present

## 2016-04-13 DIAGNOSIS — M4316 Spondylolisthesis, lumbar region: Secondary | ICD-10-CM | POA: Diagnosis not present

## 2016-04-13 DIAGNOSIS — M4126 Other idiopathic scoliosis, lumbar region: Secondary | ICD-10-CM | POA: Diagnosis not present

## 2016-04-13 DIAGNOSIS — M9903 Segmental and somatic dysfunction of lumbar region: Secondary | ICD-10-CM | POA: Diagnosis not present

## 2016-04-13 DIAGNOSIS — Q72812 Congenital shortening of left lower limb: Secondary | ICD-10-CM | POA: Diagnosis not present

## 2016-04-13 DIAGNOSIS — M9904 Segmental and somatic dysfunction of sacral region: Secondary | ICD-10-CM | POA: Diagnosis not present

## 2016-04-16 DIAGNOSIS — M9904 Segmental and somatic dysfunction of sacral region: Secondary | ICD-10-CM | POA: Diagnosis not present

## 2016-04-16 DIAGNOSIS — M9903 Segmental and somatic dysfunction of lumbar region: Secondary | ICD-10-CM | POA: Diagnosis not present

## 2016-04-16 DIAGNOSIS — M5136 Other intervertebral disc degeneration, lumbar region: Secondary | ICD-10-CM | POA: Diagnosis not present

## 2016-04-16 DIAGNOSIS — M4316 Spondylolisthesis, lumbar region: Secondary | ICD-10-CM | POA: Diagnosis not present

## 2016-04-16 DIAGNOSIS — Q72812 Congenital shortening of left lower limb: Secondary | ICD-10-CM | POA: Diagnosis not present

## 2016-04-16 DIAGNOSIS — M9905 Segmental and somatic dysfunction of pelvic region: Secondary | ICD-10-CM | POA: Diagnosis not present

## 2016-04-16 DIAGNOSIS — M4126 Other idiopathic scoliosis, lumbar region: Secondary | ICD-10-CM | POA: Diagnosis not present

## 2016-04-22 DIAGNOSIS — M9903 Segmental and somatic dysfunction of lumbar region: Secondary | ICD-10-CM | POA: Diagnosis not present

## 2016-04-22 DIAGNOSIS — M9904 Segmental and somatic dysfunction of sacral region: Secondary | ICD-10-CM | POA: Diagnosis not present

## 2016-04-22 DIAGNOSIS — M4316 Spondylolisthesis, lumbar region: Secondary | ICD-10-CM | POA: Diagnosis not present

## 2016-04-22 DIAGNOSIS — M5136 Other intervertebral disc degeneration, lumbar region: Secondary | ICD-10-CM | POA: Diagnosis not present

## 2016-04-22 DIAGNOSIS — M4126 Other idiopathic scoliosis, lumbar region: Secondary | ICD-10-CM | POA: Diagnosis not present

## 2016-04-22 DIAGNOSIS — M9905 Segmental and somatic dysfunction of pelvic region: Secondary | ICD-10-CM | POA: Diagnosis not present

## 2016-04-22 DIAGNOSIS — Q72812 Congenital shortening of left lower limb: Secondary | ICD-10-CM | POA: Diagnosis not present

## 2016-05-26 DIAGNOSIS — M5136 Other intervertebral disc degeneration, lumbar region: Secondary | ICD-10-CM | POA: Diagnosis not present

## 2016-05-26 DIAGNOSIS — M9903 Segmental and somatic dysfunction of lumbar region: Secondary | ICD-10-CM | POA: Diagnosis not present

## 2016-05-26 DIAGNOSIS — M9905 Segmental and somatic dysfunction of pelvic region: Secondary | ICD-10-CM | POA: Diagnosis not present

## 2016-05-26 DIAGNOSIS — Q72812 Congenital shortening of left lower limb: Secondary | ICD-10-CM | POA: Diagnosis not present

## 2016-05-26 DIAGNOSIS — M9904 Segmental and somatic dysfunction of sacral region: Secondary | ICD-10-CM | POA: Diagnosis not present

## 2016-05-26 DIAGNOSIS — M4126 Other idiopathic scoliosis, lumbar region: Secondary | ICD-10-CM | POA: Diagnosis not present

## 2016-05-26 DIAGNOSIS — M4316 Spondylolisthesis, lumbar region: Secondary | ICD-10-CM | POA: Diagnosis not present

## 2016-05-31 DIAGNOSIS — S00262A Insect bite (nonvenomous) of left eyelid and periocular area, initial encounter: Secondary | ICD-10-CM | POA: Diagnosis not present

## 2016-05-31 DIAGNOSIS — S0012XA Contusion of left eyelid and periocular area, initial encounter: Secondary | ICD-10-CM | POA: Diagnosis not present

## 2016-05-31 DIAGNOSIS — Z6826 Body mass index (BMI) 26.0-26.9, adult: Secondary | ICD-10-CM | POA: Diagnosis not present

## 2016-06-05 DIAGNOSIS — H524 Presbyopia: Secondary | ICD-10-CM | POA: Diagnosis not present

## 2016-06-05 DIAGNOSIS — H43393 Other vitreous opacities, bilateral: Secondary | ICD-10-CM | POA: Diagnosis not present

## 2016-06-05 DIAGNOSIS — H2513 Age-related nuclear cataract, bilateral: Secondary | ICD-10-CM | POA: Diagnosis not present

## 2016-06-05 DIAGNOSIS — H25043 Posterior subcapsular polar age-related cataract, bilateral: Secondary | ICD-10-CM | POA: Diagnosis not present

## 2016-06-05 DIAGNOSIS — E039 Hypothyroidism, unspecified: Secondary | ICD-10-CM | POA: Diagnosis not present

## 2016-06-05 DIAGNOSIS — H52223 Regular astigmatism, bilateral: Secondary | ICD-10-CM | POA: Diagnosis not present

## 2016-06-06 DIAGNOSIS — H43393 Other vitreous opacities, bilateral: Secondary | ICD-10-CM | POA: Insufficient documentation

## 2016-06-06 DIAGNOSIS — H52223 Regular astigmatism, bilateral: Secondary | ICD-10-CM | POA: Insufficient documentation

## 2016-06-06 DIAGNOSIS — H524 Presbyopia: Secondary | ICD-10-CM | POA: Insufficient documentation

## 2016-07-24 DIAGNOSIS — H2511 Age-related nuclear cataract, right eye: Secondary | ICD-10-CM | POA: Diagnosis not present

## 2016-07-24 DIAGNOSIS — H25041 Posterior subcapsular polar age-related cataract, right eye: Secondary | ICD-10-CM | POA: Diagnosis not present

## 2016-07-29 DIAGNOSIS — M4316 Spondylolisthesis, lumbar region: Secondary | ICD-10-CM | POA: Diagnosis not present

## 2016-07-29 DIAGNOSIS — M9905 Segmental and somatic dysfunction of pelvic region: Secondary | ICD-10-CM | POA: Diagnosis not present

## 2016-07-29 DIAGNOSIS — M9903 Segmental and somatic dysfunction of lumbar region: Secondary | ICD-10-CM | POA: Diagnosis not present

## 2016-07-29 DIAGNOSIS — M9904 Segmental and somatic dysfunction of sacral region: Secondary | ICD-10-CM | POA: Diagnosis not present

## 2016-07-29 DIAGNOSIS — M4126 Other idiopathic scoliosis, lumbar region: Secondary | ICD-10-CM | POA: Diagnosis not present

## 2016-07-29 DIAGNOSIS — Q72812 Congenital shortening of left lower limb: Secondary | ICD-10-CM | POA: Diagnosis not present

## 2016-07-29 DIAGNOSIS — M5136 Other intervertebral disc degeneration, lumbar region: Secondary | ICD-10-CM | POA: Diagnosis not present

## 2016-07-31 DIAGNOSIS — H2510 Age-related nuclear cataract, unspecified eye: Secondary | ICD-10-CM | POA: Insufficient documentation

## 2016-07-31 DIAGNOSIS — H25041 Posterior subcapsular polar age-related cataract, right eye: Secondary | ICD-10-CM | POA: Insufficient documentation

## 2016-08-03 DIAGNOSIS — H25041 Posterior subcapsular polar age-related cataract, right eye: Secondary | ICD-10-CM | POA: Diagnosis not present

## 2016-08-03 DIAGNOSIS — H2511 Age-related nuclear cataract, right eye: Secondary | ICD-10-CM | POA: Diagnosis not present

## 2016-08-05 ENCOUNTER — Other Ambulatory Visit: Payer: Self-pay | Admitting: Family Medicine

## 2016-08-05 DIAGNOSIS — F329 Major depressive disorder, single episode, unspecified: Secondary | ICD-10-CM

## 2016-08-05 DIAGNOSIS — F32A Depression, unspecified: Secondary | ICD-10-CM

## 2016-08-06 ENCOUNTER — Telehealth: Payer: Self-pay | Admitting: Family Medicine

## 2016-08-06 DIAGNOSIS — F32A Depression, unspecified: Secondary | ICD-10-CM

## 2016-08-06 DIAGNOSIS — F329 Major depressive disorder, single episode, unspecified: Secondary | ICD-10-CM

## 2016-08-06 MED ORDER — VENLAFAXINE HCL ER 37.5 MG PO CP24: 37.5000 mg | ORAL_CAPSULE | Freq: Every day | ORAL | 1 refills | Status: DC

## 2016-08-06 NOTE — Telephone Encounter (Signed)
Rx faxed.    KP 

## 2016-08-06 NOTE — Telephone Encounter (Signed)
Relation to PO:[id number] Call back number:[phone] Pharmacy: [facility], [place] - [address] AT [address]  Reason for call:  Patient requesting a refill venlafaxine XR (EFFEXOR XR) 37.5 MG 24 hr capsule generic brand, please advise

## 2016-08-13 DIAGNOSIS — H2512 Age-related nuclear cataract, left eye: Secondary | ICD-10-CM | POA: Diagnosis not present

## 2016-08-21 DIAGNOSIS — H25042 Posterior subcapsular polar age-related cataract, left eye: Secondary | ICD-10-CM | POA: Insufficient documentation

## 2016-08-24 DIAGNOSIS — H25042 Posterior subcapsular polar age-related cataract, left eye: Secondary | ICD-10-CM | POA: Diagnosis not present

## 2016-08-24 DIAGNOSIS — H2512 Age-related nuclear cataract, left eye: Secondary | ICD-10-CM | POA: Diagnosis not present

## 2016-09-14 ENCOUNTER — Telehealth: Payer: Self-pay | Admitting: Family Medicine

## 2016-09-14 MED ORDER — FLUOXETINE HCL 40 MG PO CAPS: 40.0000 mg | ORAL_CAPSULE | Freq: Every day | ORAL | 3 refills | Status: DC

## 2016-09-14 NOTE — Telephone Encounter (Signed)
Caller name: Terisha Relationship to patient: self Can be reached: [phone] Pharmacy: [facility], [place] - [address]  Reason for call: Pt states she talked to Dr. Etter Sjogren about increasing Prozac to 40mg  daily. Pharmacy will not fill RX as previous RX was 20mg  daily. Pt requesting new RX be sent in. She is out.

## 2016-09-14 NOTE — Telephone Encounter (Signed)
Pt states she talked to Dr. Etter Sjogren about increasing Prozac to 40mg  daily. Pharmacy will not fill RX as previous RX was 20mg  daily. Pt requesting new RX be sent in. She is out. Please advise.

## 2016-09-14 NOTE — Telephone Encounter (Signed)
Sent Rx Prozac to pharmacy [facility]. Spoke with pt to inform her that her Rx is sent and ready for pick up. LB

## 2016-09-14 NOTE — Telephone Encounter (Signed)
Ok to send prozac 40 mg #90  1 po qd , 3 refills

## 2016-09-27 ENCOUNTER — Other Ambulatory Visit: Payer: Self-pay | Admitting: Family Medicine

## 2016-09-27 DIAGNOSIS — F329 Major depressive disorder, single episode, unspecified: Secondary | ICD-10-CM

## 2016-09-27 DIAGNOSIS — F32A Depression, unspecified: Secondary | ICD-10-CM

## 2016-10-25 ENCOUNTER — Other Ambulatory Visit: Payer: Self-pay | Admitting: Family Medicine

## 2016-10-25 DIAGNOSIS — F329 Major depressive disorder, single episode, unspecified: Secondary | ICD-10-CM

## 2016-10-25 DIAGNOSIS — F32A Depression, unspecified: Secondary | ICD-10-CM

## 2016-11-20 ENCOUNTER — Other Ambulatory Visit: Payer: Self-pay | Admitting: Family Medicine

## 2016-11-20 DIAGNOSIS — F32A Depression, unspecified: Secondary | ICD-10-CM

## 2016-11-20 DIAGNOSIS — F329 Major depressive disorder, single episode, unspecified: Secondary | ICD-10-CM

## 2016-11-23 NOTE — Telephone Encounter (Signed)
Spoke with patient and rx sent in for refill

## 2016-11-23 NOTE — Telephone Encounter (Signed)
Patient is requesting a refill of this medication and Paxil she is completely out and has withdraw symptoms if not taking this medication. Please advise  Pharmacy: [id number] - [place], [place] - [address] AT [place]

## 2016-11-25 ENCOUNTER — Telehealth: Payer: Self-pay | Admitting: *Deleted

## 2016-11-25 NOTE — Telephone Encounter (Signed)
Note forwarded from Santiago Glad, LPN dated [id number] regarding patient's need for medication stating, "Caller is out of her Effexor and Paxil and pharmacy says it's too early to fill, needs an on call to call the pharmacy to approve an emergency supply until the office reopens. Having symptoms of a headache and light headed without it". Upon speaking with patient via telephone; this matter was taken care of on Mon, 11/23/16.SLS 02/07

## 2017-02-01 ENCOUNTER — Other Ambulatory Visit: Payer: Self-pay | Admitting: Family Medicine

## 2017-02-23 ENCOUNTER — Ambulatory Visit: Payer: Self-pay | Admitting: Family Medicine

## 2017-03-23 ENCOUNTER — Ambulatory Visit: Payer: Self-pay | Admitting: Family Medicine

## 2017-03-23 ENCOUNTER — Ambulatory Visit (INDEPENDENT_AMBULATORY_CARE_PROVIDER_SITE_OTHER): Payer: Medicare Other | Admitting: Family Medicine

## 2017-03-23 ENCOUNTER — Encounter: Payer: Self-pay | Admitting: Family Medicine

## 2017-03-23 VITALS — BP 106/74 | HR 69 | Temp 98.0°F | Ht 66.5 in | Wt 183.6 lb

## 2017-03-23 DIAGNOSIS — Z780 Asymptomatic menopausal state: Secondary | ICD-10-CM

## 2017-03-23 DIAGNOSIS — Z0000 Encounter for general adult medical examination without abnormal findings: Secondary | ICD-10-CM

## 2017-03-23 DIAGNOSIS — K219 Gastro-esophageal reflux disease without esophagitis: Secondary | ICD-10-CM | POA: Diagnosis not present

## 2017-03-23 DIAGNOSIS — F322 Major depressive disorder, single episode, severe without psychotic features: Secondary | ICD-10-CM

## 2017-03-23 DIAGNOSIS — Z1239 Encounter for other screening for malignant neoplasm of breast: Secondary | ICD-10-CM

## 2017-03-23 MED ORDER — RANITIDINE HCL 150 MG PO TABS: 150.0000 mg | ORAL_TABLET | Freq: Two times a day (BID) | ORAL | 11 refills | Status: DC

## 2017-03-23 MED ORDER — FLUOXETINE HCL 20 MG PO CAPS: ORAL_CAPSULE | ORAL | 3 refills | Status: DC

## 2017-03-23 NOTE — Progress Notes (Signed)
Subjective:   Denise Lowery is a 71 y.o. female who presents for Medicare Annual (Subsequent) preventive examination.  She has lost 3 close family members (mother, younger brother, and aunt)  in the last 2 years and 'I have no one else.' Reports she misses her family members 'really bad' and feels this is contributing to her worsening depression.   Review of Systems:  No ROS.  Medicare Wellness Visit.  Cardiac Risk Factors include: advanced age (>6men, >31 women)  Sleep patterns: has difficulty falling asleep, has daytime sleepiness, gets up 2 times nightly to void and sleeps 7 hours nightly. No daytime naps.  Home Safety/Smoke Alarms: Feels safe in home. Smoke alarms in place.  Living environment; residence and Firearm Safety: Lives alone. 1-story house/ trailer, no firearms. Seat Belt Safety/Bike Helmet: Wears seat belt.   Counseling:   Eye Exam- Follows Dr. Antionette Fairy  Dental- Dr. Clide Cliff every 6 months  Female:   Pap- Aged out     Mammo- last 01/22/15. BI-RADS CATEGORY 1: Negative.      Dexa scan- last 01/22/15. Osteopenia.       CCS- Not on file.     Objective:     Vitals: BP 106/74   Pulse 69   Temp 98 F (36.7 C) (Oral)   Ht 5' 6.5" (1.689 m)   Wt 183 lb 9.6 oz (83.3 kg)   SpO2 98%   BMI 29.19 kg/m   Body mass index is 29.19 kg/m.   Tobacco History  Smoking Status  . Never Smoker  Smokeless Tobacco  . Never Used     Counseling given: Not Answered   Past Medical History:  Diagnosis Date  . Bronchitis   . Depression   . Environmental allergies   . Thyroid disease    Past Surgical History:  Procedure Laterality Date  . APPENDECTOMY  1972  . CATARACT EXTRACTION, BILATERAL  October and December 2017   Dr. Antionette Fairy  . KNEE SURGERY     RIGHT  . TONSILLECTOMY  1998   Family History  Problem Relation Age of Onset  . Stroke Father 55  . Heart disease Brother 17       MI  . Heart disease Cousin 74       MI   History  Sexual Activity    . Sexual activity: Not Currently  . Partners: Male    Outpatient Encounter Prescriptions as of 03/23/2017  Medication Sig  . Calcium Carbonate Antacid (TUMS PO) Take 1 tablet by mouth as needed.  . clindamycin (CLEOCIN) 300 MG capsule Reported on 02/24/2016  . levothyroxine (SYNTHROID, LEVOTHROID) 75 MCG tablet TAKE 1 TABLET(75 MCG) BY MOUTH DAILY  . MELATONIN PO Take 1 tablet by mouth at bedtime as needed.  . venlafaxine XR (EFFEXOR-XR) 37.5 MG 24 hr capsule TAKE 1 CAPSULE(37.5 MG) BY MOUTH DAILY WITH BREAKFAST  . vitamin B-12 (CYANOCOBALAMIN) 1000 MCG tablet Take 1,000 mcg by mouth daily.  . [DISCONTINUED] FLUoxetine (PROZAC) 20 MG tablet TAKE 1 TABLET BY MOUTH EVERY DAY  . [DISCONTINUED] FLUoxetine (PROZAC) 40 MG capsule Take 1 capsule (40 mg total) by mouth daily.  Marland Kitchen FLUoxetine (PROZAC) 20 MG capsule 3 po qd  . ranitidine (ZANTAC) 150 MG tablet Take 1 tablet (150 mg total) by mouth 2 (two) times daily.  . [DISCONTINUED] cetirizine (ZYRTEC) 10 MG tablet As directed   No facility-administered encounter medications on file as of 03/23/2017.     Activities of Daily Living In your present state of health,  do you have any difficulty performing the following activities: 03/23/2017  Hearing? N  Vision? N  Difficulty concentrating or making decisions? N  Walking or climbing stairs? N  Dressing or bathing? N  Doing errands, shopping? N  Preparing Food and eating ? N  Using the Toilet? N  In the past six months, have you accidently leaked urine? N  Do you have problems with loss of bowel control? N  Managing your Medications? N  Managing your Finances? N  Housekeeping or managing your Housekeeping? N  Some recent data might be hidden    Patient Care Team: Carollee Herter, Alferd Apa, DO as PCP - General (Family Medicine) Shirl Harris, [place] as Consulting Physician (Optometry) Alycia Rossetti, DC as Consulting Physician (Chiropractic Medicine) Clide Cliff, DDS as Consulting Physician  (Dentistry) Antionette Fairy, Isaias Cowman, MD as Consulting Physician (Ophthalmology)    Assessment:    Physical assessment deferred to PCP.  Exercise Activities and Dietary recommendations Current Exercise Habits: Structured exercise class  Diet (meal preparation, eat out, water intake, caffeinated beverages, dairy products, fruits and vegetables): in general, a "healthy" diet  , well balanced, on average, 2-3 meals per day. Eats fruit through the day. Drinks 8 16 oz bottles of water daily. Feels like diet is healthy, but 'it could improve.' Breakfast: eggs and yogurt Lunch: turkey/ham sandwich Dinner:  Chicken or fish and veggies  Goals    . Have 3 meals a day      Fall Risk Fall Risk  03/23/2017 02/24/2016 02/24/2016 01/08/2015 12/25/2013  Falls in the past year? No No No Yes No  Number falls in past yr: - - - 1 -  Injury with Fall? - - - No -  Follow up - - - (No Data) -   Depression Screen PHQ 2/9 Scores 03/23/2017 02/24/2016 02/24/2016 01/08/2015  PHQ - 2 Score 5 0 0 1  PHQ- 9 Score 15 - - -     Cognitive Function MMSE - Mini Mental State Exam 03/23/2017  Orientation to time 5  Orientation to Place 5  Registration 3  Attention/ Calculation 5  Recall 3  Language- name 2 objects 2  Language- repeat 1  Language- follow 3 step command 3  Language- read & follow direction 1  Write a sentence 1  Copy design 0  Total score 29        Immunization History  Administered Date(s) Administered  . Influenza-Unspecified 08/19/2014  . Pneumococcal Conjugate-13 02/24/2016  . Pneumococcal Polysaccharide-23 12/25/2013  . Tdap 10/20/1999, 07/26/2012   Screening Tests Health Maintenance  Topic Date Due  . Hepatitis C Screening  10-14-1946  . COLONOSCOPY  08/18/1997  . MAMMOGRAM  01/21/2017  . INFLUENZA VACCINE  05/19/2017  . TETANUS/TDAP  07/26/2022  . DEXA SCAN  Completed  . PNA vac Low Risk Adult  Completed      Plan:    Follow-up w/ PCP as directed.   Bring a copy of your advance  directives to your next office visit.  Counseling resource list provided.  Orders for Cologuard placed via portal. Order #[id number].   I have personally reviewed and noted the following in the patient's chart:   . Medical and social history . Use of alcohol, tobacco or illicit drugs  . Current medications and supplements . Functional ability and status . Nutritional status . Physical activity . Advanced directives . List of other physicians . Vitals . Screenings to include cognitive, depression, and falls . Referrals and appointments  In  addition, I have reviewed and discussed with patient certain preventive protocols, quality metrics, and best practice recommendations. A written personalized care plan for preventive services as well as general preventive health recommendations were provided to patient.     Dorrene German, RN  03/23/2017

## 2017-03-23 NOTE — Progress Notes (Addendum)
Patient ID: Denise Lowery, female    DOB: 1946/08/21  Age: 71 y.o. MRN: [id number]    Subjective:  Subjective  HPI Denise Lowery presents for gerd --- nexium she is afraid of because of whats been in the news\ She also is here with worsening depression.  She is not suicidal.  Review of Systems  Constitutional: Negative for activity change, appetite change, fatigue and unexpected weight change.  Respiratory: Negative for cough and shortness of breath.   Cardiovascular: Negative for chest pain and palpitations.  Gastrointestinal: Negative for abdominal distention, abdominal pain, anal bleeding, blood in stool, constipation, diarrhea, nausea, rectal pain and vomiting.  Psychiatric/Behavioral: Positive for decreased concentration and dysphoric mood. Negative for behavioral problems and suicidal ideas. The patient is not nervous/anxious.     History Past Medical History:  Diagnosis Date  . Bronchitis   . Depression   . Environmental allergies   . Thyroid disease     She has a past surgical history that includes Tonsillectomy (1998); Appendectomy (1972); Knee surgery; and Cataract extraction, bilateral (October and December 2017).   Her family history includes Heart disease (age of onset: 45) in her brother; Heart disease (age of onset: 14) in her cousin; Stroke (age of onset: 35) in her father.She reports that she has never smoked. She has never used smokeless tobacco. She reports that she drinks about 3.0 oz of alcohol per week . She reports that she does not use drugs.  Current Outpatient Prescriptions on File Prior to Visit  Medication Sig Dispense Refill  . clindamycin (CLEOCIN) 300 MG capsule Reported on 02/24/2016    . levothyroxine (SYNTHROID, LEVOTHROID) 75 MCG tablet TAKE 1 TABLET(75 MCG) BY MOUTH DAILY 30 tablet 11  . venlafaxine XR (EFFEXOR-XR) 37.5 MG 24 hr capsule TAKE 1 CAPSULE(37.5 MG) BY MOUTH DAILY WITH BREAKFAST 90 capsule 1   No current facility-administered medications on  file prior to visit.      Objective:  Objective  Physical Exam  Constitutional: She is oriented to person, place, and time. She appears well-developed and well-nourished. No distress.  HENT:  Right Ear: External ear normal.  Left Ear: External ear normal.  Nose: Nose normal.  Mouth/Throat: Oropharynx is clear and moist.  Eyes: EOM are normal. Pupils are equal, round, and reactive to light.  Neck: Normal range of motion. Neck supple.  Cardiovascular: Normal rate, regular rhythm and normal heart sounds.   No murmur heard. Pulmonary/Chest: Effort normal and breath sounds normal. No respiratory distress. She has no wheezes. She has no rales. She exhibits no tenderness.  Abdominal: Soft. She exhibits no distension and no mass. There is no tenderness. There is no rebound and no guarding.  Neurological: She is alert and oriented to person, place, and time.  Psychiatric: Her behavior is normal. Judgment and thought content normal. Her mood appears not anxious. Her affect is not angry, not blunt, not labile and not inappropriate. She exhibits a depressed mood. She expresses no homicidal and no suicidal ideation. She expresses no suicidal plans and no homicidal plans.  Nursing note and vitals reviewed.  BP 106/74   Pulse 69   Temp 98 F (36.7 C) (Oral)   Ht 5' 6.5" (1.689 m)   Wt 183 lb 9.6 oz (83.3 kg)   SpO2 98%   BMI 29.19 kg/m  Wt Readings from Last 3 Encounters:  03/23/17 183 lb 9.6 oz (83.3 kg)  02/24/16 186 lb 6.4 oz (84.6 kg)  05/15/15 186 lb 9.6 oz (84.6 kg)  Lab Results  Component Value Date   WBC 6.7 01/08/2015   HGB 13.2 01/08/2015   HCT 39.4 01/08/2015   PLT 303.0 01/08/2015   GLUCOSE 75 01/08/2015   CHOL 212 (H) 01/08/2015   TRIG 123.0 01/08/2015   HDL 72.00 01/08/2015   LDLCALC 115 (H) 01/08/2015   ALT 11 01/08/2015   AST 19 01/08/2015   NA 132 (L) 01/08/2015   K 3.6 01/08/2015   CL 98 01/08/2015   CREATININE 0.71 01/08/2015   BUN 11 01/08/2015   CO2 28  01/08/2015   TSH 1.29 01/08/2015    Dg Lumbar Spine Complete  Result Date: 11/22/2012 *RADIOLOGY REPORT* Clinical Data: Left-sided back pain.  No history of injury.  Pain for 2 months. LUMBAR SPINE - COMPLETE 4+ VIEW Comparison: None. Findings: There are five non-rib bearing lumbar-type vertebral bodies.  There is moderate scoliosis convexity to the right.  SI joints appear intact.  Intervertebral disc spaces are maintained. There is minimal multilevel osteophyte formation representing degenerative spondylosis.  There is apophyseal joint degenerative spondylosis at L4-L5 and L5-S1 levels.  No dislocation or bony destruction is evident.  No pars defects were evident.  There is fecal distention of portions of the colon. IMPRESSION: Scoliosis.  Degenerative spondylosis.  Fecal distention of portions of the colon. Original Report Authenticated By: Shanon Brow Call   Dg Hip Complete Left  Result Date: 11/22/2012 *RADIOLOGY REPORT* Clinical Data: 53-month history of left-sided back pain and left hip pain.  No history of injury. LEFT HIP - COMPLETE 2+ VIEW Comparison: None. Findings: There is degenerative spondylosis.  SI joints appear normal.  Bony pelvis appears intact.  Hip joint spaces are preserved.  No fracture or bony destruction is evident.  No calcific bursitis or calcific tendonitis is evident.  There is fecal distention of portions of the colon. IMPRESSION: No rib lesion is identified.  Degenerative spondylosis.  Fecal distention of portions of the colon. Original Report Authenticated By: Shanon Brow Call     Assessment & Plan:  Plan  I have discontinued Ms. Banos's cetirizine, FLUoxetine, and FLUoxetine. I am also having her start on ranitidine and FLUoxetine. Additionally, I am having her maintain her clindamycin, levothyroxine, venlafaxine XR, vitamin B-12, MELATONIN PO, and Calcium Carbonate Antacid (TUMS PO).  Meds ordered this encounter  Medications  . vitamin B-12 (CYANOCOBALAMIN) 1000 MCG tablet     Sig: Take 1,000 mcg by mouth daily.  Marland Kitchen MELATONIN PO    Sig: Take 1 tablet by mouth at bedtime as needed.  . Calcium Carbonate Antacid (TUMS PO)    Sig: Take 1 tablet by mouth as needed.  . ranitidine (ZANTAC) 150 MG tablet    Sig: Take 1 tablet (150 mg total) by mouth 2 (two) times daily.    Dispense:  60 tablet    Refill:  11  . FLUoxetine (PROZAC) 20 MG capsule    Sig: 3 po qd    Dispense:  360 capsule    Refill:  3    Problem List Items Addressed This Visit      Unprioritized   Gastroesophageal reflux disease    Zantac If no relief consider GI      Relevant Medications   Calcium Carbonate Antacid (TUMS PO)   ranitidine (ZANTAC) 150 MG tablet   Current severe episode of major depressive disorder without psychotic features without prior episode (Rafael Gonzalez)    On prozac consder changing meds Consider couseling--- pt did not want to do at this time  Relevant Medications   FLUoxetine (PROZAC) 20 MG capsule    Other Visit Diagnoses    Encounter for Medicare annual wellness exam    -  Primary   Screening for breast cancer       Relevant Orders   MM Digital Screening (Completed)   Asymptomatic postmenopausal state       Relevant Orders   DG Bone Density (Completed)      Follow-up: No Follow-up on file.  Ann Held, DO

## 2017-03-23 NOTE — Patient Instructions (Addendum)
Denise Lowery , Thank you for taking time to come for your Medicare Wellness Visit. I appreciate your ongoing commitment to your health goals. Please review the following plan we discussed and let me know if I can assist you in the future.   Bring a copy of your advance directives to your next office visit.  These are the goals we discussed: Goals    . Have 3 meals a day       This is a list of the screening recommended for you and due dates:  Health Maintenance  Topic Date Due  .  Hepatitis C: One time screening is recommended by Center for Disease Control  (CDC) for  adults born from [birth year] through 1964.   1946/01/13  . Colon Cancer Screening  08/18/1997  . Mammogram  01/21/2017  . Flu Shot  05/19/2017  . Tetanus Vaccine  07/26/2022  . DEXA scan (bone density measurement)  Completed  . Pneumonia vaccines  Completed   Preventive Care 70 Years and Older, Female Preventive care refers to lifestyle choices and visits with your health care provider that can promote health and wellness. What does preventive care include?  A yearly physical exam. This is also called an annual well check.  Dental exams once or twice a year.  Routine eye exams. Ask your health care provider how often you should have your eyes checked.  Personal lifestyle choices, including: ? Daily care of your teeth and gums. ? Regular physical activity. ? Eating a healthy diet. ? Avoiding tobacco and drug use. ? Limiting alcohol use. ? Practicing safe sex. ? Taking low-dose aspirin every day. ? Taking vitamin and mineral supplements as recommended by your health care provider. What happens during an annual well check? The services and screenings done by your health care provider during your annual well check will depend on your age, overall health, lifestyle risk factors, and family history of disease. Counseling Your health care provider may ask you questions about your:  Alcohol use.  Tobacco use.  Drug  use.  Emotional well-being.  Home and relationship well-being.  Sexual activity.  Eating habits.  History of falls.  Memory and ability to understand (cognition).  Work and work Statistician.  Reproductive health.  Screening You may have the following tests or measurements:  Height, weight, and BMI.  Blood pressure.  Lipid and cholesterol levels. These may be checked every 5 years, or more frequently if you are over 68 years old.  Skin check.  Lung cancer screening. You may have this screening every year starting at age 70 if you have a 30-pack-year history of smoking and currently smoke or have quit within the past 15 years.  Fecal occult blood test (FOBT) of the stool. You may have this test every year starting at age 70.  Flexible sigmoidoscopy or colonoscopy. You may have a sigmoidoscopy every 5 years or a colonoscopy every 10 years starting at age 75.  Hepatitis C blood test.  Hepatitis B blood test.  Sexually transmitted disease (STD) testing.  Diabetes screening. This is done by checking your blood sugar (glucose) after you have not eaten for a while (fasting). You may have this done every 1-3 years.  Bone density scan. This is done to screen for osteoporosis. You may have this done starting at age 20.  Mammogram. This may be done every 1-2 years. Talk to your health care provider about how often you should have regular mammograms.  Talk with your health care provider about  your test results, treatment options, and if necessary, the need for more tests. Vaccines Your health care provider may recommend certain vaccines, such as:  Influenza vaccine. This is recommended every year.  Tetanus, diphtheria, and acellular pertussis (Tdap, Td) vaccine. You may need a Td booster every 10 years.  Varicella vaccine. You may need this if you have not been vaccinated.  Zoster vaccine. You may need this after age 82.  Measles, mumps, and rubella (MMR) vaccine. You  may need at least one dose of MMR if you were born in 1957 or later. You may also need a second dose.  Pneumococcal 13-valent conjugate (PCV13) vaccine. One dose is recommended after age 20.  Pneumococcal polysaccharide (PPSV23) vaccine. One dose is recommended after age 49.  Meningococcal vaccine. You may need this if you have certain conditions.  Hepatitis A vaccine. You may need this if you have certain conditions or if you travel or work in places where you may be exposed to hepatitis A.  Hepatitis B vaccine. You may need this if you have certain conditions or if you travel or work in places where you may be exposed to hepatitis B.  Haemophilus influenzae type b (Hib) vaccine. You may need this if you have certain conditions.  Talk to your health care provider about which screenings and vaccines you need and how often you need them. This information is not intended to replace advice given to you by your health care provider. Make sure you discuss any questions you have with your health care provider. Document Released: 11/01/2015 Document Revised: 06/24/2016 Document Reviewed: 08/06/2015 Elsevier Interactive Patient Education  2017 [place] for Gastroesophageal Reflux Disease, Adult When you have gastroesophageal reflux disease (GERD), the foods you eat and your eating habits are very important. Choosing the right foods can help ease your discomfort. What guidelines do I need to follow?  Choose fruits, vegetables, whole grains, and low-fat dairy products.  Choose low-fat meat, fish, and poultry.  Limit fats such as oils, salad dressings, butter, nuts, and avocado.  Keep a food diary. This helps you identify foods that cause symptoms.  Avoid foods that cause symptoms. These may be different for everyone.  Eat small meals often instead of 3 large meals a day.  Eat your meals slowly, in a place where you are relaxed.  Limit fried foods.  Cook foods using  methods other than frying.  Avoid drinking alcohol.  Avoid drinking large amounts of liquids with your meals.  Avoid bending over or lying down until 2-3 hours after eating. What foods are not recommended? These are some foods and drinks that may make your symptoms worse: Vegetables Tomatoes. Tomato juice. Tomato and spaghetti sauce. Chili peppers. Onion and garlic. Horseradish. Fruits Oranges, grapefruit, and lemon (fruit and juice). Meats High-fat meats, fish, and poultry. This includes hot dogs, ribs, ham, sausage, salami, and bacon. Dairy Whole milk and chocolate milk. Sour cream. Cream. Butter. Ice cream. Cream cheese. Drinks Coffee and tea. Bubbly (carbonated) drinks or energy drinks. Condiments Hot sauce. Barbecue sauce. Sweets/Desserts Chocolate and cocoa. Donuts. Peppermint and spearmint. Fats and Oils High-fat foods. This includes [place] fries and potato chips. Other Vinegar. Strong spices. This includes black pepper, white pepper, red pepper, cayenne, curry powder, cloves, ginger, and chili powder. The items listed above may not be a complete list of foods and drinks to avoid. Contact your dietitian for more information. This information is not intended to replace advice given to you by your  health care provider. Make sure you discuss any questions you have with your health care provider. Document Released: 04/05/2012 Document Revised: 03/12/2016 Document Reviewed: 08/09/2013 Elsevier Interactive Patient Education  2017 [employer].

## 2017-03-25 ENCOUNTER — Ambulatory Visit (HOSPITAL_BASED_OUTPATIENT_CLINIC_OR_DEPARTMENT_OTHER)
Admission: RE | Admit: 2017-03-25 | Discharge: 2017-03-25 | Disposition: A | Discharge status: Home / Self Care | Payer: Medicare Other | Source: Ambulatory Visit | Attending: Family Medicine | Admitting: Family Medicine

## 2017-03-25 DIAGNOSIS — Z780 Asymptomatic menopausal state: Secondary | ICD-10-CM

## 2017-03-25 DIAGNOSIS — Z1231 Encounter for screening mammogram for malignant neoplasm of breast: Secondary | ICD-10-CM | POA: Diagnosis not present

## 2017-03-25 DIAGNOSIS — Z1239 Encounter for other screening for malignant neoplasm of breast: Secondary | ICD-10-CM

## 2017-03-25 DIAGNOSIS — M85851 Other specified disorders of bone density and structure, right thigh: Secondary | ICD-10-CM | POA: Insufficient documentation

## 2017-03-29 ENCOUNTER — Encounter: Payer: Self-pay | Admitting: Family Medicine

## 2017-03-29 DIAGNOSIS — K219 Gastro-esophageal reflux disease without esophagitis: Secondary | ICD-10-CM | POA: Insufficient documentation

## 2017-03-29 NOTE — Assessment & Plan Note (Signed)
Zantac If no relief consider GI

## 2017-04-09 LAB — COLOGUARD: COLOGUARD: NEGATIVE

## 2017-04-15 ENCOUNTER — Telehealth: Payer: Self-pay | Admitting: *Deleted

## 2017-04-15 NOTE — Telephone Encounter (Signed)
Cologuard result negative. Letter mailed to pt and result abstracted.

## 2017-04-26 ENCOUNTER — Telehealth: Payer: Self-pay | Admitting: Family Medicine

## 2017-04-26 DIAGNOSIS — F322 Major depressive disorder, single episode, severe without psychotic features: Secondary | ICD-10-CM | POA: Insufficient documentation

## 2017-04-26 NOTE — Telephone Encounter (Signed)
Dec prozac to 10 mg daily #30  ---- start trintellix 10 mg #30  1 po qd, F/u 3-4 weeks

## 2017-04-26 NOTE — Telephone Encounter (Signed)
Pt called in to make provider aware that medication FLUoxetine isn't working. She would like to be advised on what she should do next?      CB: [phone]

## 2017-04-26 NOTE — Assessment & Plan Note (Signed)
On prozac consder changing meds Consider couseling--- pt did not want to do at this time

## 2017-04-27 ENCOUNTER — Telehealth: Payer: Self-pay

## 2017-04-27 MED ORDER — PAROXETINE HCL ER 25 MG PO TB24: 25.0000 mg | ORAL_TABLET | Freq: Every day | ORAL | 5 refills | Status: DC

## 2017-04-27 MED ORDER — VORTIOXETINE HBR 10 MG PO TABS: 1.0000 | ORAL_TABLET | Freq: Every day | ORAL | 2 refills | Status: DC

## 2017-04-27 NOTE — Telephone Encounter (Signed)
Updated list and sent in paxil to her pharmacy. Notified patient to inform of change---left her a detailed message

## 2017-04-27 NOTE — Telephone Encounter (Signed)
PCP did ok to change to 10 mg capsules twice daily if PA for cr is denied. Will route to assistant covering this message awaiting PA decision.

## 2017-04-27 NOTE — Telephone Encounter (Signed)
PA initiated via Covermymeds; KEY: [id number]. Awaiting determination.

## 2017-04-27 NOTE — Telephone Encounter (Signed)
Insurance will not pay for the CR--patient states should cover plain/ taking 2 capsules (10 mg)  A day.

## 2017-04-27 NOTE — Addendum Note (Signed)
Addended by: Sharon Seller B on: 04/27/2017 12:59 PM   Modules accepted: Orders

## 2017-04-27 NOTE — Telephone Encounter (Signed)
paxil cr 25 mg #30  1 po qd,  5 refills

## 2017-04-27 NOTE — Telephone Encounter (Signed)
Updated medication list Sent in new medication Notified patient--left message to call back.

## 2017-04-27 NOTE — Telephone Encounter (Signed)
Agree 

## 2017-04-27 NOTE — Telephone Encounter (Signed)
Patient returned the call and did inform her of medication instructions. Scheduled her an appointment as well. She verbalized understanding.

## 2017-04-27 NOTE — Telephone Encounter (Signed)
Patient called back to inform that the trintellix is too expensive and could not get--$60 for 30 day supply-- In the past she has been on paxil and effexor>  So would be willing to try one of those again.

## 2017-04-28 ENCOUNTER — Telehealth: Payer: Self-pay

## 2017-04-28 MED ORDER — PAROXETINE HCL 10 MG PO TABS: 10.0000 mg | ORAL_TABLET | Freq: Two times a day (BID) | ORAL | 3 refills | Status: DC

## 2017-04-28 NOTE — Telephone Encounter (Addendum)
Patient returned call and would like to know the status of message below, please advise patient directly best [phone]

## 2017-04-28 NOTE — Telephone Encounter (Signed)
Received PA denial, Pt has not tried and failed Bupropion, bupropion XL, Forfivo XL, or Wellbutrin SR (buproprion SR), citalopram (Celexa), duloxetine (Cymbalta) 20, 30, 60mg  or 40mg , [place], Pristiq, escitalopram (Lexapro), Remeron (mirtazapine), or Viibyrd.   Pt spoke w/ her insurance yesterday and was informed they covered paroxetine 10mg  tabs bid. Per Dr. Etter Sjogren okay to change.

## 2017-04-28 NOTE — Telephone Encounter (Signed)
PA initiated via Covermymeds; KEY: [id number]. Awaiting determination.

## 2017-04-29 MED ORDER — VILAZODONE HCL 10 & 20 MG PO KIT: 1.0000 | PACK | ORAL | 0 refills | Status: DC

## 2017-04-29 NOTE — Telephone Encounter (Signed)
Try viibryd starter pack #1  No refills--- f/u 1 month

## 2017-04-29 NOTE — Telephone Encounter (Signed)
Paroxetine 10mg  not covered. Covered alternatives: Bupriopion, sertraline, venlafaxine (Pt already on), duloxetine 20, 30 and 60mg , citalopram (caused nausea), escitalopram, Remeron, Viibyrd, Trintillex (too expensive). Please advise.

## 2017-04-29 NOTE — Telephone Encounter (Signed)
Patient informed of instructions regarding medication. She verbalized understanding and will call us back if this medication is a problem in any way.

## 2017-04-29 NOTE — Telephone Encounter (Signed)
See both notes

## 2017-04-29 NOTE — Telephone Encounter (Signed)
Please advise on chosing on of the preferred meds for her to try

## 2017-04-29 NOTE — Telephone Encounter (Signed)
No, they will not cover Paxil.

## 2017-04-29 NOTE — Telephone Encounter (Signed)
Viibyrd starter pack sent to [facility].

## 2017-04-29 NOTE — Telephone Encounter (Signed)
She is on cymbalta and has been on effexor ------- I'm getting 2 different notes one from you and one from robin ---- they will not pay for paxil or paxil cr or both?

## 2017-04-30 ENCOUNTER — Other Ambulatory Visit: Payer: Self-pay | Admitting: Family Medicine

## 2017-05-06 ENCOUNTER — Other Ambulatory Visit: Payer: Self-pay | Admitting: Family Medicine

## 2017-05-10 ENCOUNTER — Other Ambulatory Visit: Payer: Self-pay | Admitting: Family Medicine

## 2017-05-10 DIAGNOSIS — F32A Depression, unspecified: Secondary | ICD-10-CM

## 2017-05-10 DIAGNOSIS — F329 Major depressive disorder, single episode, unspecified: Secondary | ICD-10-CM

## 2017-05-11 NOTE — Telephone Encounter (Signed)
Requesting: Venlafaxine Contract: NO UDS: NO Last OV:03/23/17 Last Refill:11/23/16 #[id number]  Please Advise

## 2017-05-11 NOTE — Telephone Encounter (Signed)
rx sent   PC 

## 2017-06-02 ENCOUNTER — Other Ambulatory Visit: Payer: Self-pay | Admitting: Family Medicine

## 2017-06-08 ENCOUNTER — Ambulatory Visit (INDEPENDENT_AMBULATORY_CARE_PROVIDER_SITE_OTHER): Payer: Medicare Other | Admitting: Family Medicine

## 2017-06-08 ENCOUNTER — Encounter: Payer: Self-pay | Admitting: Family Medicine

## 2017-06-08 VITALS — BP 122/80 | HR 67 | Wt 182.0 lb

## 2017-06-08 DIAGNOSIS — K219 Gastro-esophageal reflux disease without esophagitis: Secondary | ICD-10-CM

## 2017-06-08 DIAGNOSIS — E039 Hypothyroidism, unspecified: Secondary | ICD-10-CM | POA: Insufficient documentation

## 2017-06-08 DIAGNOSIS — F321 Major depressive disorder, single episode, moderate: Secondary | ICD-10-CM

## 2017-06-08 DIAGNOSIS — R232 Flushing: Secondary | ICD-10-CM | POA: Insufficient documentation

## 2017-06-08 MED ORDER — ESOMEPRAZOLE MAGNESIUM 40 MG PO CPDR: 40.0000 mg | DELAYED_RELEASE_CAPSULE | Freq: Every day | ORAL | 3 refills | Status: DC

## 2017-06-08 MED ORDER — FLUOXETINE HCL 20 MG PO CAPS: ORAL_CAPSULE | ORAL | 3 refills | Status: DC

## 2017-06-08 NOTE — Assessment & Plan Note (Signed)
Inc prozac to 60 mg and rto in 1 month or sooner prn

## 2017-06-08 NOTE — Assessment & Plan Note (Signed)
Maybe from thyroid / or antidepressant Call if symptoms worsen or do not improve May want to go back on effexor

## 2017-06-08 NOTE — Patient Instructions (Signed)

## 2017-06-08 NOTE — Assessment & Plan Note (Signed)
Check labs con't meds 

## 2017-06-08 NOTE — Progress Notes (Signed)
Patient ID: Denise Lowery, female    DOB: 1945/11/14  Age: 71 y.o. MRN: [id number]    Subjective:  Subjective  HPI Denise Lowery for f/u depression and c/o hot flashes since decreasing effexor.   She does want to con't to try to come off the effexor.    Review of Systems  Constitutional: Negative for fever.  HENT: Negative for congestion.   Respiratory: Negative for shortness of breath.   Cardiovascular: Negative for chest pain, palpitations and leg swelling.  Gastrointestinal: Positive for nausea. Negative for abdominal pain and blood in stool.  Genitourinary: Negative for dysuria and frequency.  Skin: Negative for rash.  Allergic/Immunologic: Negative for environmental allergies.  Neurological: Negative for dizziness and headaches.  Psychiatric/Behavioral: The patient is not nervous/anxious.     History Past Medical History:  Diagnosis Date  . Bronchitis   . Depression   . Environmental allergies   . Thyroid disease     She has a past surgical history that includes Tonsillectomy (1998); Appendectomy (1972); Knee surgery; and Cataract extraction, bilateral (October and December 2017).   Her family history includes Heart disease (age of onset: 101) in her brother; Heart disease (age of onset: 7) in her cousin; Stroke (age of onset: 76) in her father.She reports that she has never smoked. She has never used smokeless tobacco. She reports that she drinks about 3.0 oz of alcohol per week . She reports that she does not use drugs.  Current Outpatient Prescriptions on File Prior to Visit  Medication Sig Dispense Refill  . Calcium Carbonate Antacid (TUMS PO) Take 1 tablet by mouth as needed.    . clindamycin (CLEOCIN) 300 MG capsule Reported on 02/24/2016    . levothyroxine (SYNTHROID, LEVOTHROID) 75 MCG tablet TAKE 1 TABLET(75 MCG) BY MOUTH DAILY 30 tablet 0  . MELATONIN PO Take 1 tablet by mouth at bedtime as needed.    . ranitidine (ZANTAC) 150 MG tablet Take 1 tablet (150 mg  total) by mouth 2 (two) times daily. 60 tablet 11  . vitamin B-12 (CYANOCOBALAMIN) 1000 MCG tablet Take 1,000 mcg by mouth daily.     No current facility-administered medications on file prior to visit.      Objective:  Objective  Physical Exam  Constitutional: She is oriented to person, place, and time. She appears well-developed and well-nourished.  HENT:  Head: Normocephalic and atraumatic.  Eyes: Conjunctivae and EOM are normal.  Neck: Normal range of motion. Neck supple. No JVD present. Carotid bruit is not present. No thyromegaly present.  Cardiovascular: Normal rate, regular rhythm and normal heart sounds.   No murmur heard. Pulmonary/Chest: Effort normal and breath sounds normal. No respiratory distress. She has no wheezes. She has no rales. She exhibits no tenderness.  Abdominal: Soft. Bowel sounds are normal. She exhibits no distension and no mass. There is tenderness. There is no rebound.  Musculoskeletal: She exhibits no edema.  Neurological: She is alert and oriented to person, place, and time.  Psychiatric: She has a normal mood and affect.  Nursing note and vitals reviewed.  BP 122/80   Pulse 67   Wt 182 lb (82.6 kg)   SpO2 97%   BMI 28.94 kg/m  Wt Readings from Last 3 Encounters:  06/08/17 182 lb (82.6 kg)  03/23/17 183 lb 9.6 oz (83.3 kg)  02/24/16 186 lb 6.4 oz (84.6 kg)     Lab Results  Component Value Date   WBC 6.7 01/08/2015   HGB 13.2 01/08/2015   HCT  39.4 01/08/2015   PLT 303.0 01/08/2015   GLUCOSE 75 01/08/2015   CHOL 212 (H) 01/08/2015   TRIG 123.0 01/08/2015   HDL 72.00 01/08/2015   LDLCALC 115 (H) 01/08/2015   ALT 11 01/08/2015   AST 19 01/08/2015   NA 132 (L) 01/08/2015   K 3.6 01/08/2015   CL 98 01/08/2015   CREATININE 0.71 01/08/2015   BUN 11 01/08/2015   CO2 28 01/08/2015   TSH 1.29 01/08/2015    Dg Bone Density  Result Date: 03/25/2017 EXAM: DUAL X-RAY ABSORPTIOMETRY (DXA) FOR BONE MINERAL DENSITY IMPRESSION: Referring  Physician:  Rosalita Chessman CHASE PATIENT: Name: Denise Lowery Patient ID: [id number] Birth Date: January 20, 1946 Height: 67.0 in. Sex: Female Measured: 03/25/2017 Weight: 179.6 lbs. Indications: Caucasian, Estrogen Deficiency, Hypothyroidism, Post Menopausal Fractures: Treatments: Levothyroxine, Vitamin D ASSESSMENT: The BMD measured at Femur Neck Right is 0.698 g/cm2 with a T-score of -2.4. This patient is considered osteopenic according to [facility]) criteria. Lumbar spine was not utilized due to advanced degenerative changes. Site Region Measured Date Measured Age WHO YA BMD Classification T-score DualFemur Total Mean 4/25[age] 69.6 years Osteopenia -1.5 0.823 g/cm2 Left Forearm Radius 33% 03/25/2017 69.6 Osteopenia -1.9 0.709 g/cm2 World Health Organization [facility]) criteria for post-menopausal, Caucasian Women: Normal       T-score at or above -1 SD Osteopenia   T-score between -1 and -2.5 SD Osteoporosis T-score at or below -2.5 SD RECOMMENDATION: [place] recommends that FDA-approved medical therapies be considered in postmenopausal women and men age 25 or older with a: 1. Hip or vertebral (clinical or morphometric) fracture. 2. T-score of < -2.5 at the spine or hip. 3. Ten-year fracture probability by FRAX of 3% or greater for hip fracture or 20% or greater for major osteoporotic fracture. All treatment decisions require clinical judgment and consideration of individual patient factors, including patient preferences, co-morbidities, previous drug use, risk factors not captured in the FRAX model (e.g. falls, vitamin D deficiency, increased bone turnover, interval significant decline in bone density) and possible under - or over-estimation of fracture risk by FRAX. All patients should ensure an adequate intake of dietary calcium (1200 mg/d) and vitamin D (800 IU daily) unless contraindicated. FOLLOW-UP: People with diagnosed cases of osteoporosis or at high risk for fracture should  have regular bone mineral density tests. For patients eligible for Medicare, routine testing is allowed once every 2 years. The testing frequency can be increased to one year for patients who have rapidly progressing disease, those who are receiving or discontinuing medical therapy to restore bone mass, or have additional risk factors. I have reviewed this report and agree with the above findings. [facility] Patient: Denise Lowery   Referring Physician: Rosalita Chessman CHASE Birth Date: 1946-03-06 [age] Patient ID: [id number] Height: 67.0 in. Weight: 179.6 lbs. Measured: 03/25/2017 9:12:53 AM (16 SP 2) Sex: Female Ethnicity: White Analyzed: 03/25/2017 9:44:50 AM (16 SP 2) FRAX* 10-year Probability of Fracture Based on femoral neck BMD: DualFemur (Right) Major Osteoporotic Fracture: 14.2% Hip Fracture:                3.5% Population:                  [place] (Caucasian) Risk Factors:                None *FRAX is a Materials engineer of the [employer] for Metabolic Bone Disease, a World  Health Organization [facility]) [place]. ASSESSMENT: The probability of a major osteoporotic fracture is 14.2% within the next ten years. The probability of a hip fracture is 3.5% within the next ten years. Electronically Signed   By: Lowella Grip III M.D.   On: 03/25/2017 10:03   Mm Digital Screening  Result Date: 03/25/2017 CLINICAL DATA:  Screening. EXAM: DIGITAL SCREENING BILATERAL MAMMOGRAM WITH CAD COMPARISON:  Previous exam(s). ACR Breast Density Category b: There are scattered areas of fibroglandular density. FINDINGS: There are no findings suspicious for malignancy. Images were processed with CAD. IMPRESSION: No mammographic evidence of malignancy. A result letter of this screening mammogram will be mailed directly to the patient. RECOMMENDATION: Screening mammogram in one year. (Code:SM-B-01Y) BI-RADS CATEGORY  1: Negative. Electronically Signed   By: Fidela Salisbury M.D.   On: 03/25/2017 16:40     Assessment & Plan:  Plan  I have discontinued Ms. Escajeda's Vilazodone HCl, venlafaxine XR, and FLUoxetine. I am also having her start on FLUoxetine and esomeprazole. Additionally, I am having her maintain her clindamycin, vitamin B-12, MELATONIN PO, Calcium Carbonate Antacid (TUMS PO), ranitidine, and levothyroxine.  Meds ordered this encounter  Medications  . DISCONTD: FLUoxetine (PROZAC) 40 MG capsule    Refill:  1  . FLUoxetine (PROZAC) 20 MG capsule    Sig: 3 po qd    Dispense:  90 capsule    Refill:  3  . esomeprazole (NEXIUM) 40 MG capsule    Sig: Take 1 capsule (40 mg total) by mouth daily.    Dispense:  30 capsule    Refill:  3    Problem List Items Addressed This Visit      Unprioritized   Depression, major, single episode, moderate ([place]) - Primary    Inc prozac to 60 mg and rto in 1 month or sooner prn      Relevant Medications   FLUoxetine (PROZAC) 20 MG capsule   Other Relevant Orders   Lipid panel   CBC with Differential/Platelet   TSH   Comprehensive metabolic panel   T3, free   T4, free   Gastroesophageal reflux disease    Worsening symptoms Add nexium and refer to GI       Relevant Medications   esomeprazole (NEXIUM) 40 MG capsule   Other Relevant Orders   Ambulatory referral to Gastroenterology   H. pylori antibody, IgG   Hot flashes    Maybe from thyroid / or antidepressant Call if symptoms worsen or do not improve May want to go back on effexor      Relevant Orders   Lipid panel   CBC with Differential/Platelet   TSH   Comprehensive metabolic panel   T3, free   T4, free   Hypothyroidism    Check labs  con't meds      Relevant Orders   Lipid panel   CBC with Differential/Platelet   TSH   Comprehensive metabolic panel   T3, free   T4, free      Follow-up: Return in about 4 weeks (around 07/06/2017).  Ann Held, DO

## 2017-06-08 NOTE — Assessment & Plan Note (Signed)
Worsening symptoms Add nexium and refer to GI

## 2017-06-09 LAB — COMPREHENSIVE METABOLIC PANEL
ALT: 11 U/L (ref 0–35)
AST: 17 U/L (ref 0–37)
Albumin: 4.4 g/dL (ref 3.5–5.2)
Alkaline Phosphatase: 69 U/L (ref 39–117)
BUN: 16 mg/dL (ref 6–23)
CALCIUM: 9.9 mg/dL (ref 8.4–10.5)
CHLORIDE: 103 meq/L (ref 96–112)
CO2: 31 meq/L (ref 19–32)
Creatinine, Ser: 0.87 mg/dL (ref 0.40–1.20)
GFR: 68.45 mL/min (ref >60.00)
Glucose, Bld: 80 mg/dL (ref 70–99)
POTASSIUM: 4.5 meq/L (ref 3.5–5.1)
Sodium: 138 mEq/L (ref 135–145)
Total Bilirubin: 0.9 mg/dL (ref 0.2–1.2)
Total Protein: 6.9 g/dL (ref 6.0–8.3)

## 2017-06-09 LAB — CBC WITH DIFFERENTIAL/PLATELET
BASOS ABS: 0 10*3/uL (ref 0.0–0.1)
BASOS PCT: 0.6 % (ref 0.0–3.0)
EOS PCT: 5.2 % — AB (ref 0.0–5.0)
Eosinophils Absolute: 0.3 10*3/uL (ref 0.0–0.7)
HCT: 38.7 % (ref 36.0–46.0)
Hemoglobin: 12.9 g/dL (ref 12.0–15.0)
LYMPHS ABS: 1.2 10*3/uL (ref 0.7–4.0)
Lymphocytes Relative: 19.8 % (ref 12.0–46.0)
MCHC: 33.2 g/dL (ref 30.0–36.0)
MCV: 97.6 fl (ref 78.0–100.0)
Monocytes Absolute: 0.5 10*3/uL (ref 0.1–1.0)
Monocytes Relative: 8.7 % (ref 3.0–12.0)
NEUTROS ABS: 3.8 10*3/uL (ref 1.4–7.7)
NEUTROS PCT: 65.7 % (ref 43.0–77.0)
PLATELETS: 289 10*3/uL (ref 150.0–400.0)
RBC: 3.97 Mil/uL (ref 3.87–5.11)
RDW: 12.9 % (ref 11.5–15.5)
WBC: 5.8 10*3/uL (ref 4.0–10.5)

## 2017-06-09 LAB — LIPID PANEL
CHOL/HDL RATIO: 4
Cholesterol: 220 mg/dL — ABNORMAL HIGH (ref 0–200)
HDL: 62.5 mg/dL (ref >39.00)
LDL Cholesterol: 123 mg/dL — ABNORMAL HIGH (ref 0–99)
NonHDL: 157.53
TRIGLYCERIDES: 174 mg/dL — AB (ref 0.0–149.0)
VLDL: 34.8 mg/dL (ref 0.0–40.0)

## 2017-06-09 LAB — T3, FREE: T3 FREE: 3 pg/mL (ref 2.3–4.2)

## 2017-06-09 LAB — T4, FREE: FREE T4: 0.86 ng/dL (ref 0.60–1.60)

## 2017-06-09 LAB — TSH: TSH: 1.43 u[IU]/mL (ref 0.35–4.50)

## 2017-06-09 LAB — H. PYLORI ANTIBODY, IGG: H PYLORI IGG: NEGATIVE

## 2017-06-10 ENCOUNTER — Encounter: Payer: Self-pay | Admitting: Nurse Practitioner

## 2017-06-10 ENCOUNTER — Other Ambulatory Visit: Payer: Self-pay | Admitting: Family Medicine

## 2017-06-10 DIAGNOSIS — E785 Hyperlipidemia, unspecified: Secondary | ICD-10-CM

## 2017-06-25 ENCOUNTER — Ambulatory Visit: Payer: Self-pay | Admitting: Nurse Practitioner

## 2017-07-02 ENCOUNTER — Other Ambulatory Visit: Payer: Self-pay | Admitting: Family Medicine

## 2017-07-08 ENCOUNTER — Ambulatory Visit: Payer: Self-pay | Admitting: Nurse Practitioner

## 2017-08-04 ENCOUNTER — Other Ambulatory Visit: Payer: Self-pay | Admitting: Family Medicine

## 2017-08-07 ENCOUNTER — Other Ambulatory Visit: Payer: Self-pay | Admitting: Family Medicine

## 2017-08-07 DIAGNOSIS — F329 Major depressive disorder, single episode, unspecified: Secondary | ICD-10-CM

## 2017-08-07 DIAGNOSIS — F32A Depression, unspecified: Secondary | ICD-10-CM

## 2017-08-26 ENCOUNTER — Other Ambulatory Visit: Payer: Self-pay | Admitting: Family Medicine

## 2017-08-26 DIAGNOSIS — F321 Major depressive disorder, single episode, moderate: Secondary | ICD-10-CM

## 2017-08-26 MED ORDER — FLUOXETINE HCL 20 MG PO CAPS: 60.0000 mg | ORAL_CAPSULE | Freq: Every day | ORAL | 3 refills | Status: DC

## 2017-09-13 ENCOUNTER — Encounter: Payer: Self-pay | Admitting: Gastroenterology

## 2017-09-13 ENCOUNTER — Other Ambulatory Visit: Payer: Self-pay

## 2017-09-15 ENCOUNTER — Telehealth: Payer: Self-pay | Admitting: Family Medicine

## 2017-09-15 ENCOUNTER — Other Ambulatory Visit (INDEPENDENT_AMBULATORY_CARE_PROVIDER_SITE_OTHER): Payer: Medicare Other

## 2017-09-15 DIAGNOSIS — E785 Hyperlipidemia, unspecified: Secondary | ICD-10-CM

## 2017-09-15 LAB — COMPREHENSIVE METABOLIC PANEL
ALBUMIN: 4.4 g/dL (ref 3.5–5.2)
ALT: 13 U/L (ref 0–35)
AST: 17 U/L (ref 0–37)
Alkaline Phosphatase: 70 U/L (ref 39–117)
BUN: 21 mg/dL (ref 6–23)
CALCIUM: 9.6 mg/dL (ref 8.4–10.5)
CHLORIDE: 101 meq/L (ref 96–112)
CO2: 28 meq/L (ref 19–32)
Creatinine, Ser: 0.64 mg/dL (ref 0.40–1.20)
GFR: 97.48 mL/min (ref >60.00)
GLUCOSE: 85 mg/dL (ref 70–99)
Potassium: 4 mEq/L (ref 3.5–5.1)
Sodium: 136 mEq/L (ref 135–145)
TOTAL PROTEIN: 6.8 g/dL (ref 6.0–8.3)
Total Bilirubin: 1.1 mg/dL (ref 0.2–1.2)

## 2017-09-15 LAB — LIPID PANEL
CHOL/HDL RATIO: 4
Cholesterol: 240 mg/dL — ABNORMAL HIGH (ref 0–200)
HDL: 67.1 mg/dL (ref >39.00)
LDL CALC: 153 mg/dL — AB (ref 0–99)
NonHDL: 172.93
Triglycerides: 102 mg/dL (ref 0.0–149.0)
VLDL: 20.4 mg/dL (ref 0.0–40.0)

## 2017-09-15 NOTE — Telephone Encounter (Signed)
Pt would like for her Rx of Fluoxetine 20mg  to be moved from 3 tablets daily to 4 tablets daily. Pt states that by taking one extra it seems to help more.  Please advise CB:[phone]

## 2017-09-16 MED ORDER — FLUOXETINE HCL 40 MG PO CAPS: 80.0000 mg | ORAL_CAPSULE | Freq: Every day | ORAL | 0 refills | Status: DC

## 2017-09-16 NOTE — Addendum Note (Signed)
Addended by: Kem Boroughs D on: 09/16/2017 04:32 PM   Modules accepted: Orders

## 2017-09-16 NOTE — Telephone Encounter (Signed)
Ok to take 40 mg ---  2 po qd #60

## 2017-09-16 NOTE — Telephone Encounter (Signed)
Patient notified rx sent in.

## 2017-09-20 ENCOUNTER — Other Ambulatory Visit: Payer: Self-pay | Admitting: Family Medicine

## 2017-09-20 DIAGNOSIS — E785 Hyperlipidemia, unspecified: Secondary | ICD-10-CM

## 2017-10-11 ENCOUNTER — Other Ambulatory Visit: Payer: Self-pay | Admitting: Family Medicine

## 2017-10-11 DIAGNOSIS — K219 Gastro-esophageal reflux disease without esophagitis: Secondary | ICD-10-CM

## 2017-10-14 ENCOUNTER — Other Ambulatory Visit: Payer: Self-pay | Admitting: Family Medicine

## 2017-10-31 ENCOUNTER — Other Ambulatory Visit: Payer: Self-pay | Admitting: Family Medicine

## 2017-11-03 ENCOUNTER — Ambulatory Visit: Payer: Medicare Other | Admitting: Gastroenterology

## 2017-11-03 ENCOUNTER — Encounter: Payer: Self-pay | Admitting: Gastroenterology

## 2017-11-03 ENCOUNTER — Encounter (INDEPENDENT_AMBULATORY_CARE_PROVIDER_SITE_OTHER): Payer: Self-pay

## 2017-11-03 VITALS — BP 128/74 | HR 70 | Ht 66.5 in | Wt 188.0 lb

## 2017-11-03 DIAGNOSIS — K2100 Gastro-esophageal reflux disease with esophagitis, without bleeding: Secondary | ICD-10-CM

## 2017-11-03 DIAGNOSIS — R1310 Dysphagia, unspecified: Secondary | ICD-10-CM | POA: Diagnosis not present

## 2017-11-03 DIAGNOSIS — Z1212 Encounter for screening for malignant neoplasm of rectum: Secondary | ICD-10-CM | POA: Diagnosis not present

## 2017-11-03 DIAGNOSIS — Z1211 Encounter for screening for malignant neoplasm of colon: Secondary | ICD-10-CM | POA: Diagnosis not present

## 2017-11-03 NOTE — Progress Notes (Signed)
History of Present Illness: This is a 72 year old female referred by Denise Lowery, Alferd Apa, *DO for the evaluation of GERD with erosive esophagitis and dysphasia.  She has a long history of GERD and previously underwent EGD in November 2005 showing LA Grade A erosive esophagitis and erosive gastritis.  She was treated with Nexium at the time.  I last saw her in 2007 and her reflux symptoms were under control on daily Nexium 40 mg.  She states that she came off acid reflux medications for several years however they were restarted by her PCP 2-3 years ago when her reflux symptoms returned.  She requires Nexium daily to manage her symptoms. She takes ranitidine 150 mg daily as needed for breakthrough symptoms.  She has tried several times to discontinue Nexium with a prompt return of heartburn symptoms.  She describes ongoing solid food dysphagia which has not progressed but she states she takes care to cut meat into very small pieces.  She is concerned that these symptoms are related to her scoliosis.  She underwent Cologuard testing in June 2018 which was negative.  She had declined colonoscopy. Denies weight loss, abdominal pain, constipation, diarrhea, change in stool caliber, melena, hematochezia, nausea, vomiting, chest pain.   Allergies  Allergen Reactions  . Penicillins Itching and Swelling    Swelling of lips Itching on palms of hand   Outpatient Medications Prior to Visit  Medication Sig Dispense Refill  . Calcium Carbonate Antacid (TUMS PO) Take 1 tablet by mouth as needed.    Marland Kitchen esomeprazole (NEXIUM) 40 MG capsule TAKE 1 CAPSULE(40 MG) BY MOUTH DAILY 30 capsule 0  . FLUoxetine (PROZAC) 40 MG capsule TAKE 2 CAPSULES(80 MG) BY MOUTH DAILY 60 capsule 0  . levothyroxine (SYNTHROID, LEVOTHROID) 75 MCG tablet TAKE 1 TABLET BY MOUTH DAILY 90 tablet 0  . MELATONIN PO Take 1 tablet by mouth at bedtime as needed.    . ranitidine (ZANTAC) 150 MG tablet Take 1 tablet (150 mg total) by mouth 2 (two)  times daily. 60 tablet 11  . clindamycin (CLEOCIN) 300 MG capsule Reported on 02/24/2016    . levothyroxine (SYNTHROID, LEVOTHROID) 75 MCG tablet TAKE 1 TABLET BY MOUTH DAILY 30 tablet 0  . vitamin B-12 (CYANOCOBALAMIN) 1000 MCG tablet Take 1,000 mcg by mouth daily.     No facility-administered medications prior to visit.    Past Medical History:  Diagnosis Date  . Bronchitis   . Depression   . Environmental allergies   . GERD (gastroesophageal reflux disease)   . Thyroid disease    Past Surgical History:  Procedure Laterality Date  . APPENDECTOMY  1972  . CATARACT EXTRACTION, BILATERAL  October and December 2017   Dr. Antionette Fairy  . KNEE SURGERY     RIGHT  . TONSILLECTOMY  1998   Social History   Socioeconomic History  . Marital status: Single    Spouse name: None  . Number of children: None  . Years of education: None  . Highest education level: None  Social Needs  . Financial resource strain: None  . Food insecurity - worry: None  . Food insecurity - inability: None  . Transportation needs - medical: None  . Transportation needs - non-medical: None  Occupational History  . Occupation: Editor, commissioning , [employer]  Tobacco Use  . Smoking status: Never Smoker  . Smokeless tobacco: Never Used  Substance and Sexual Activity  . Alcohol use: Yes    Alcohol/week: 3.0 oz  Types: 5 Glasses of wine per week    Comment: Socially  . Drug use: No  . Sexual activity: Not Currently    Partners: Male  Other Topics Concern  . None  Social History Narrative   Exercise-- 3x a week,  Walking, weights, ellipitical, bike       Family History  Problem Relation Age of Onset  . Stroke Father 55  . Heart disease Brother 40       MI  . Heart disease Cousin 70       MI      Review of Systems: Pertinent positive and negative review of systems were noted in the above HPI section. All other review of systems were otherwise negative.    Physical Exam: General: Well developed, well  nourished, no acute distress Head: Normocephalic and atraumatic Eyes:  sclerae anicteric, EOMI Ears: Normal auditory acuity Mouth: No deformity or lesions Neck: Supple, no masses or thyromegaly Lungs: Clear throughout to auscultation Heart: Regular rate and rhythm; no murmurs, rubs or bruits Abdomen: Soft, non tender and non distended. No masses, hepatosplenomegaly or hernias noted. Normal Bowel sounds Rectal: not done Musculoskeletal: Symmetrical with no gross deformities  Skin: No lesions on visible extremities Pulses:  Normal pulses noted Extremities: No clubbing, cyanosis, edema or deformities noted Neurological: Alert oriented x 4, grossly nonfocal Cervical Nodes:  No significant cervical adenopathy Inguinal Nodes: No significant inguinal adenopathy Psychological:  Alert and cooperative. Normal mood and affect  Assessment and Recommendations:  1.  GERD with history of erosive esophagitis.  Solid food dysphagia.  Rule out esophagitis, esophageal stricture, Barrett's esophagus.  Follow standard antireflux measures.  Continue Nexium 40 mg daily and ranitidine 150 mg daily as needed.  Schedule EGD with possible dilation. The risks (including bleeding, perforation, infection, missed lesions, medication reactions and possible hospitalization or surgery if complications occur), benefits, and alternatives to endoscopy with possible biopsy and possible dilation were discussed with the patient and they consent to proceed.   2.  CRC screening, average risk.  Cologuard was negative in June 2018.  I encouraged her to consider colonoscopy for colon cancer screening in June 2021 and if not colonoscopy then she will proceed with Cologuard.  cc: Ann Held, DO [address] [place], [place] [id number]

## 2017-11-03 NOTE — Patient Instructions (Signed)
You have been scheduled for an endoscopy. Please follow written instructions given to you at your visit today. If you use inhalers (even only as needed), please bring them with you on the day of your procedure. Your physician has requested that you go to www.startemmi.com and enter the access code given to you at your visit today. This web site gives a general overview about your procedure. However, you should still follow specific instructions given to you by our office regarding your preparation for the procedure.  Normal BMI (Body Mass Index- based on height and weight) is between 23 and 30. Your BMI today is Body mass index is 29.89 kg/m. Marland Kitchen Please consider follow up  regarding your BMI with your Primary Care Provider.  Thank you for choosing me and [facility].  Pricilla Riffle. Dagoberto Ligas., MD., Marval Regal

## 2017-11-08 ENCOUNTER — Encounter: Payer: Self-pay | Admitting: Gastroenterology

## 2017-11-11 ENCOUNTER — Other Ambulatory Visit: Payer: Self-pay | Admitting: Family Medicine

## 2017-11-11 DIAGNOSIS — K219 Gastro-esophageal reflux disease without esophagitis: Secondary | ICD-10-CM

## 2017-11-16 ENCOUNTER — Telehealth: Payer: Self-pay | Admitting: Gastroenterology

## 2017-11-18 ENCOUNTER — Encounter: Payer: Self-pay | Admitting: Gastroenterology

## 2017-11-30 ENCOUNTER — Encounter: Payer: Self-pay | Admitting: Gastroenterology

## 2017-12-08 ENCOUNTER — Ambulatory Visit (AMBULATORY_SURGERY_CENTER): Payer: Medicare Other | Admitting: Gastroenterology

## 2017-12-08 ENCOUNTER — Other Ambulatory Visit: Payer: Self-pay

## 2017-12-08 ENCOUNTER — Encounter: Payer: Self-pay | Admitting: Gastroenterology

## 2017-12-08 VITALS — BP 128/73 | HR 57 | Temp 97.8°F | Resp 16 | Ht 66.0 in | Wt 188.0 lb

## 2017-12-08 DIAGNOSIS — K2100 Gastro-esophageal reflux disease with esophagitis, without bleeding: Secondary | ICD-10-CM

## 2017-12-08 DIAGNOSIS — R1310 Dysphagia, unspecified: Secondary | ICD-10-CM

## 2017-12-08 DIAGNOSIS — K222 Esophageal obstruction: Secondary | ICD-10-CM | POA: Diagnosis not present

## 2017-12-08 MED ORDER — SODIUM CHLORIDE 0.9 % IV SOLN: 500.0000 mL | Freq: Once | INTRAVENOUS | Status: AC

## 2017-12-08 NOTE — Patient Instructions (Signed)
YOU HAD AN ENDOSCOPIC PROCEDURE TODAY AT [facility]:   Refer to the procedure report that was given to you for any specific questions about what was found during the examination.  If the procedure report does not answer your questions, please call your gastroenterologist to clarify.  If you requested that your care partner not be given the details of your procedure findings, then the procedure report has been included in a sealed envelope for you to review at your convenience later.  YOU SHOULD EXPECT: Some feelings of bloating in the abdomen. Passage of more gas than usual.  Walking can help get rid of the air that was put into your GI tract during the procedure and reduce the bloating. If you had a lower endoscopy (such as a colonoscopy or flexible sigmoidoscopy) you may notice spotting of blood in your stool or on the toilet paper. If you underwent a bowel prep for your procedure, you may not have a normal bowel movement for a few days.  Please Note:  You might notice some irritation and congestion in your nose or some drainage.  This is from the oxygen used during your procedure.  There is no need for concern and it should clear up in a day or so.  SYMPTOMS TO REPORT IMMEDIATELY:   Following upper endoscopy (EGD)  Vomiting of blood or coffee ground material  New chest pain or pain under the shoulder blades  Painful or persistently difficult swallowing  New shortness of breath  Fever of 100F or higher  Black, tarry-looking stools  For urgent or emergent issues, a gastroenterologist can be reached at any hour by calling [phone].   DIET:  Follow a post-dilation diet. Clear liquids for one hour starting at 1100 today, then soft diet for the remainder of today starting at 1200, you may proceed to your regular diet as tolerated tomorrow.  Drink plenty of fluids but you should avoid alcoholic beverages for 24 hours.  MEDICATIONS: Continue present medications.  Please  see handouts given to you by your recovery nurse.  ACTIVITY:  You should plan to take it easy for the rest of today and you should NOT DRIVE or use heavy machinery until tomorrow (because of the sedation medicines used during the test).    FOLLOW UP: Our staff will call the number listed on your records the next business day following your procedure to check on you and address any questions or concerns that you may have regarding the information given to you following your procedure. If we do not reach you, we will leave a message.  However, if you are feeling well and you are not experiencing any problems, there is no need to return our call.  We will assume that you have returned to your regular daily activities without incident.  If any biopsies were taken you will be contacted by phone or by letter within the next 1-3 weeks.  Please call us at [phone] if you have not heard about the biopsies in 3 weeks.   Thank you for allowing [place] to provide for your healthcare needs today.  SIGNATURES/CONFIDENTIALITY: You and/or your care partner have signed paperwork which will be entered into your electronic medical record.  These signatures attest to the fact that that the information above on your After Visit Summary has been reviewed and is understood.  Full responsibility of the confidentiality of this discharge information lies with you and/or your care-partner.

## 2017-12-08 NOTE — Progress Notes (Signed)
Report given to PACU, vss 

## 2017-12-08 NOTE — Progress Notes (Signed)
Pt's states no medical or surgical changes since previsit or office visit. 

## 2017-12-08 NOTE — Progress Notes (Signed)
Called to room to assist during endoscopic procedure.  Patient ID and intended procedure confirmed with present staff. Received instructions for my participation in the procedure from the performing physician.  

## 2017-12-08 NOTE — Op Note (Addendum)
[facility] Patient Name: Denise Lowery Procedure Date: 12/08/2017 9:49 AM MRN: [id number] Endoscopist: Ladene Artist , MD Age: 72 Referring MD:  Date of Birth: 06/15/1946 Gender: Female Account #: [id number] Procedure:                Upper GI endoscopy Indications:              Dysphagia, Reflux esophagitis Medicines:                Monitored Anesthesia Care Procedure:                Pre-Anesthesia Assessment:                           - Prior to the procedure, a History and Physical                            was performed, and patient medications and                            allergies were reviewed. The patient's tolerance of                            previous anesthesia was also reviewed. The risks                            and benefits of the procedure and the sedation                            options and risks were discussed with the patient.                            All questions were answered, and informed consent                            was obtained. Prior Anticoagulants: The patient has                            taken no previous anticoagulant or antiplatelet                            agents. ASA Grade Assessment: II - A patient with                            mild systemic disease. After reviewing the risks                            and benefits, the patient was deemed in                            satisfactory condition to undergo the procedure.                           After obtaining informed consent, the endoscope was  passed under direct vision. Throughout the                            procedure, the patient's blood pressure, pulse, and                            oxygen saturations were monitored continuously. The                            Endoscope was introduced through the mouth, and                            advanced to the second part of duodenum. The upper                            GI endoscopy was  accomplished without difficulty.                            The patient tolerated the procedure well. Scope In: Scope Out: Findings:                 One mild benign-appearing, intrinsic stenosis was                            found at the gastroesophageal junction. This                            measured 1.4 cm (inner diameter) and was traversed.                            A guidewire was placed and the scope was withdrawn.                            Dilations were performed with Savary dilators with                            mild resistance at 15 mm and 16 mm.                           The exam of the esophagus was otherwise normal.                           A medium-sized hiatal hernia was present.                           The exam of the stomach was otherwise normal.                           The duodenal bulb and second portion of the                            duodenum were normal. Complications:            No immediate complications. Estimated Blood Loss:     Estimated blood loss: none.  Impression:               - Benign-appearing esophageal stenosis. Dilated.                           - Medium-sized hiatal hernia.                           - Normal duodenal bulb and second portion of the                            duodenum.                           - No specimens collected. Recommendation:           - Patient has a contact number available for                            emergencies. The signs and symptoms of potential                            delayed complications were discussed with the                            patient. Return to normal activities tomorrow.                            Written discharge instructions were provided to the                            patient.                           - Clear liquid diet for 2 hours, then advance as                            tolerated to soft diet today.                           - Continue present medications.                            - GI office appointment in 2 months. Ladene Artist, MD 12/08/2017 10:09:09 AM This report has been signed electronically.

## 2017-12-09 ENCOUNTER — Telehealth: Payer: Self-pay

## 2017-12-09 NOTE — Telephone Encounter (Signed)
  Follow up Call-  Call Rett Stehlik number 12/08/2017  Post procedure Call Jerrett Baldinger phone  # [phone]  Permission to leave phone message Yes  Some recent data might be hidden     Patient questions:  Do you have a fever, pain , or abdominal swelling? No. Pain Score  0 *  Have you tolerated food without any problems? Yes.    Have you been able to return to your normal activities? Yes.    Do you have any questions about your discharge instructions: Diet   No. Medications  No. Follow up visit  No.  Do you have questions or concerns about your Care? No.  Actions: * If pain score is 4 or above: No action needed, pain <4.

## 2017-12-12 ENCOUNTER — Other Ambulatory Visit: Payer: Self-pay | Admitting: Family Medicine

## 2017-12-12 DIAGNOSIS — K219 Gastro-esophageal reflux disease without esophagitis: Secondary | ICD-10-CM

## 2017-12-13 ENCOUNTER — Other Ambulatory Visit: Payer: Self-pay | Admitting: Family Medicine

## 2017-12-13 DIAGNOSIS — K219 Gastro-esophageal reflux disease without esophagitis: Secondary | ICD-10-CM

## 2017-12-20 ENCOUNTER — Other Ambulatory Visit: Payer: Medicare Other

## 2018-01-09 ENCOUNTER — Other Ambulatory Visit: Payer: Self-pay | Admitting: Family Medicine

## 2018-01-28 ENCOUNTER — Other Ambulatory Visit: Payer: Self-pay | Admitting: Family Medicine

## 2018-02-07 ENCOUNTER — Other Ambulatory Visit: Payer: Self-pay | Admitting: Family Medicine

## 2018-03-09 ENCOUNTER — Other Ambulatory Visit: Payer: Self-pay | Admitting: Family Medicine

## 2018-03-10 ENCOUNTER — Other Ambulatory Visit: Payer: Self-pay | Admitting: Family Medicine

## 2018-03-10 DIAGNOSIS — K219 Gastro-esophageal reflux disease without esophagitis: Secondary | ICD-10-CM

## 2018-03-18 ENCOUNTER — Other Ambulatory Visit: Payer: Self-pay | Admitting: Neurological Surgery

## 2018-03-18 DIAGNOSIS — M4316 Spondylolisthesis, lumbar region: Secondary | ICD-10-CM

## 2018-03-24 ENCOUNTER — Ambulatory Visit
Admission: RE | Admit: 2018-03-24 | Discharge: 2018-03-24 | Disposition: A | Discharge status: Home / Self Care | Payer: Medicare Other | Source: Ambulatory Visit | Attending: Neurological Surgery | Admitting: Neurological Surgery

## 2018-03-24 DIAGNOSIS — M4316 Spondylolisthesis, lumbar region: Secondary | ICD-10-CM

## 2018-03-29 ENCOUNTER — Encounter: Payer: Self-pay | Admitting: Family Medicine

## 2018-03-29 ENCOUNTER — Ambulatory Visit (INDEPENDENT_AMBULATORY_CARE_PROVIDER_SITE_OTHER): Payer: Medicare Other | Admitting: Family Medicine

## 2018-03-29 VITALS — BP 125/73 | HR 64 | Temp 97.9°F | Resp 16 | Ht 66.0 in | Wt 185.0 lb

## 2018-03-29 DIAGNOSIS — F325 Major depressive disorder, single episode, in full remission: Secondary | ICD-10-CM | POA: Diagnosis not present

## 2018-03-29 DIAGNOSIS — E039 Hypothyroidism, unspecified: Secondary | ICD-10-CM | POA: Diagnosis not present

## 2018-03-29 NOTE — Patient Instructions (Signed)
Preventive Care 65 Years and Older, Female Preventive care refers to lifestyle choices and visits with your health care provider that can promote health and wellness. What does preventive care include?  A yearly physical exam. This is also called an annual well check.  Dental exams once or twice a year.  Routine eye exams. Ask your health care provider how often you should have your eyes checked.  Personal lifestyle choices, including: ? Daily care of your teeth and gums. ? Regular physical activity. ? Eating a healthy diet. ? Avoiding tobacco and drug use. ? Limiting alcohol use. ? Practicing safe sex. ? Taking low-dose aspirin every day. ? Taking vitamin and mineral supplements as recommended by your health care provider. What happens during an annual well check? The services and screenings done by your health care provider during your annual well check will depend on your age, overall health, lifestyle risk factors, and family history of disease. Counseling Your health care provider may ask you questions about your:  Alcohol use.  Tobacco use.  Drug use.  Emotional well-being.  Home and relationship well-being.  Sexual activity.  Eating habits.  History of falls.  Memory and ability to understand (cognition).  Work and work environment.  Reproductive health.  Screening You may have the following tests or measurements:  Height, weight, and BMI.  Blood pressure.  Lipid and cholesterol levels. These may be checked every 5 years, or more frequently if you are over 50 years old.  Skin check.  Lung cancer screening. You may have this screening every year starting at age 55 if you have a 30-pack-year history of smoking and currently smoke or have quit within the past 15 years.  Fecal occult blood test (FOBT) of the stool. You may have this test every year starting at age 50.  Flexible sigmoidoscopy or colonoscopy. You may have a sigmoidoscopy every 5 years or  a colonoscopy every 10 years starting at age 50.  Hepatitis C blood test.  Hepatitis B blood test.  Sexually transmitted disease (STD) testing.  Diabetes screening. This is done by checking your blood sugar (glucose) after you have not eaten for a while (fasting). You may have this done every 1-3 years.  Bone density scan. This is done to screen for osteoporosis. You may have this done starting at age 65.  Mammogram. This may be done every 1-2 years. Talk to your health care provider about how often you should have regular mammograms.  Talk with your health care provider about your test results, treatment options, and if necessary, the need for more tests. Vaccines Your health care provider may recommend certain vaccines, such as:  Influenza vaccine. This is recommended every year.  Tetanus, diphtheria, and acellular pertussis (Tdap, Td) vaccine. You may need a Td booster every 10 years.  Varicella vaccine. You may need this if you have not been vaccinated.  Zoster vaccine. You may need this after age 60.  Measles, mumps, and rubella (MMR) vaccine. You may need at least one dose of MMR if you were born in 1957 or later. You may also need a second dose.  Pneumococcal 13-valent conjugate (PCV13) vaccine. One dose is recommended after age 65.  Pneumococcal polysaccharide (PPSV23) vaccine. One dose is recommended after age 65.  Meningococcal vaccine. You may need this if you have certain conditions.  Hepatitis A vaccine. You may need this if you have certain conditions or if you travel or work in places where you may be exposed to hepatitis   A.  Hepatitis B vaccine. You may need this if you have certain conditions or if you travel or work in places where you may be exposed to hepatitis B.  Haemophilus influenzae type b (Hib) vaccine. You may need this if you have certain conditions.  Talk to your health care provider about which screenings and vaccines you need and how often you  need them. This information is not intended to replace advice given to you by your health care provider. Make sure you discuss any questions you have with your health care provider. Document Released: 11/01/2015 Document Revised: 06/24/2016 Document Reviewed: 08/06/2015 Elsevier Interactive Patient Education  2018 [employer]  

## 2018-03-29 NOTE — Progress Notes (Signed)
Subjective:     Denise Lowery is a 72 y.o. female and is here for a comprehensive physical exam. The patient reports problems - back pain -- she sees Dr Ronnald Ramp.  Social History   Socioeconomic History  . Marital status: Single    Spouse name: Not on file  . Number of children: Not on file  . Years of education: Not on file  . Highest education level: Not on file  Occupational History  . Occupation: Editor, commissioning , [employer]  Social Needs  . Financial resource strain: Not on file  . Food insecurity:    Worry: Not on file    Inability: Not on file  . Transportation needs:    Medical: Not on file    Non-medical: Not on file  Tobacco Use  . Smoking status: Never Smoker  . Smokeless tobacco: Never Used  Substance and Sexual Activity  . Alcohol use: Yes    Alcohol/week: 3.0 oz    Types: 5 Glasses of wine per week    Comment: Socially  . Drug use: No  . Sexual activity: Not Currently    Partners: Male  Lifestyle  . Physical activity:    Days per week: Not on file    Minutes per session: Not on file  . Stress: Not on file  Relationships  . Social connections:    Talks on phone: Not on file    Gets together: Not on file    Attends religious service: Not on file    Active member of club or organization: Not on file    Attends meetings of clubs or organizations: Not on file    Relationship status: Not on file  . Intimate partner violence:    Fear of current or ex partner: Not on file    Emotionally abused: Not on file    Physically abused: Not on file    Forced sexual activity: Not on file  Other Topics Concern  . Not on file  Social History Narrative   Exercise-- 3x a week,  Walking, weights, ellipitical, bike       Health Maintenance  Topic Date Due  . MAMMOGRAM  03/25/2018  . Hepatitis C Screening  03/29/2024 (Originally 19-Oct-1946)  . INFLUENZA VACCINE  05/19/2018  . Fecal DNA (Cologuard)  04/09/2020  . TETANUS/TDAP  07/26/2022  . DEXA SCAN  Completed  . PNA vac Low Risk  Adult  Completed    The following portions of the patient's history were reviewed and updated as appropriate:  She  has a past medical history of Back pain, Bronchitis, Depression, Environmental allergies, GERD (gastroesophageal reflux disease), Scoliosis, and Thyroid disease. She does not have any pertinent problems on file. She  has a past surgical history that includes Tonsillectomy (1998); Appendectomy (1972); Knee surgery; and Cataract extraction, bilateral (October and December 2017). Her family history includes Heart disease (age of onset: 18) in her brother; Heart disease (age of onset: 64) in her cousin; Stroke (age of onset: 57) in her father. She  reports that she has never smoked. She has never used smokeless tobacco. She reports that she drinks about 3.0 oz of alcohol per week. She reports that she does not use drugs. She has a current medication list which includes the following prescription(s): calcium carbonate antacid, esomeprazole, fluoxetine, levothyroxine, and melatonin, and the following Facility-Administered Medications: sodium chloride. Current Outpatient Medications on File Prior to Visit  Medication Sig Dispense Refill  . Calcium Carbonate Antacid (TUMS PO) Take 1 tablet by  mouth as needed.    Marland Kitchen esomeprazole (NEXIUM) 40 MG capsule TAKE 1 CAPSULE(40 MG) BY MOUTH DAILY 90 capsule 0  . FLUoxetine (PROZAC) 40 MG capsule TAKE 2 CAPSULES(80 MG) BY MOUTH DAILY 60 capsule 0  . levothyroxine (SYNTHROID, LEVOTHROID) 75 MCG tablet TAKE 1 TABLET BY MOUTH DAILY 90 tablet 0  . MELATONIN PO Take 1 tablet by mouth at bedtime as needed.     Current Facility-Administered Medications on File Prior to Visit  Medication Dose Route Frequency Provider Last Rate Last Dose  . 0.9 %  sodium chloride infusion  500 mL Intravenous Once Ladene Artist, MD       She is allergic to penicillins..  Review of Systems Review of Systems  Constitutional: Negative for activity change, appetite change  and fatigue.  HENT: Negative for hearing loss, congestion, tinnitus and ear discharge.  dentist q32m Eyes: Negative for visual disturbance (see optho q1y -- vision corrected to 20/20 with glasses).  Respiratory: Negative for cough, chest tightness and shortness of breath.   Cardiovascular: Negative for chest pain, palpitations and leg swelling.  Gastrointestinal: Negative for abdominal pain, diarrhea, constipation and abdominal distention.  Genitourinary: Negative for urgency, frequency, decreased urine volume and difficulty urinating.  Musculoskeletal: Negative for back pain, arthralgias and gait problem.  Skin: Negative for color change, pallor and rash.  Neurological: Negative for dizziness, light-headedness, numbness and headaches.  Hematological: Negative for adenopathy. Does not bruise/bleed easily.  Psychiatric/Behavioral: Negative for suicidal ideas, confusion, sleep disturbance, self-injury, dysphoric mood, decreased concentration and agitation.       Objective:    BP 125/73 (BP Location: Left Arm, Cuff Size: Normal)   Pulse 64   Temp 97.9 F (36.6 C) (Oral)   Resp 16   Ht 5\' 6"  (1.676 m)   Wt 185 lb (83.9 kg)   SpO2 97%   BMI 29.86 kg/m  General appearance: alert, cooperative, appears stated age and no distress Head: Normocephalic, without obvious abnormality, atraumatic Eyes: negative findings: lids and lashes normal, conjunctivae and sclerae normal and pupils equal, round, reactive to light and accomodation Ears: normal TM's and external ear canals both ears Nose: Nares normal. Septum midline. Mucosa normal. No drainage or sinus tenderness. Throat: lips, mucosa, and tongue normal; teeth and gums normal Neck: no adenopathy, no carotid bruit, no JVD, supple, symmetrical, trachea midline and thyroid not enlarged, symmetric, no tenderness/mass/nodules Back: symmetric, no curvature. ROM normal. No CVA tenderness. Lungs: clear to auscultation bilaterally Breasts: normal  appearance, no masses or tenderness Heart: regular rate and rhythm, S1, S2 normal, no murmur, click, rub or gallop Abdomen: soft, non-tender; bowel sounds normal; no masses,  no organomegaly Pelvic: deferred Extremities: extremities normal, atraumatic, no cyanosis or edema Pulses: 2+ and symmetric Skin: Skin color, texture, turgor normal. No rashes or lesions Lymph nodes: Cervical, supraclavicular, and axillary nodes normal. Neurologic: Alert and oriented X 3, normal strength and tone. Normal symmetric reflexes. Normal coordination and gait    Assessment:    Healthy female exam.      Plan:    ghm utd Check labs  See After Visit Summary for Counseling Recommendations    1. Hypothyroidism, unspecified type Check labs  con't meds  - CBC with Differential/Platelet - Comprehensive metabolic panel - Lipid panel - TSH  2. Depression, major, single episode, complete remission ([place]) Add zoloft  con't wellbutrin  - CBC with Differential/Platelet - Comprehensive metabolic panel - Lipid panel - TSH

## 2018-04-06 ENCOUNTER — Other Ambulatory Visit: Payer: Self-pay | Admitting: Family Medicine

## 2018-04-11 ENCOUNTER — Other Ambulatory Visit: Payer: Self-pay | Admitting: Neurological Surgery

## 2018-04-14 NOTE — Progress Notes (Addendum)
Subjective:   Denise Lowery is a 72 y.o. female who presents for Medicare Annual (Subsequent) preventive examination.  Review of Systems: No ROS.  Medicare Wellness Visit. Additional risk factors are reflected in the social history. Cardiac Risk Factors include: advanced age (>30men, >27 women) Sleep patterns:  Sleeps 8-10 hrs. No issues.  Home Safety/Smoke Alarms: Feels safe in home. Smoke alarms in place.  Living environment; residence and Firearm Safety: Lives alone in 1 story home.   Female:       Mammo- ordered       Dexa scan-utd        CCS- Cologurard 04/09/17-neg  Objective:     Vitals: BP 122/80 (BP Location: Left Arm, Patient Position: Sitting, Cuff Size: Normal)   Pulse 60   Ht 5\' 6"  (1.676 m)   Wt 184 lb 9.6 oz (83.7 kg)   SpO2 98%   BMI 29.80 kg/m   Body mass index is 29.8 kg/m.  Advanced Directives 04/19/2018 03/23/2017 02/24/2016  Does Patient Have a Medical Advance Directive? Yes No Yes  Type of Paramedic of [place];Living will - [place];Living will  Does patient want to make changes to medical advance directive? No - Patient declined No - Patient declined No - Patient declined  Copy of [place] in Chart? No - copy requested - No - copy requested    Tobacco Social History   Tobacco Use  Smoking Status Never Smoker  Smokeless Tobacco Never Used     Counseling given: Not Answered   Clinical Intake:     Pain : No/denies pain                 Past Medical History:  Diagnosis Date  . Back pain   . Bronchitis   . Depression   . Environmental allergies   . GERD (gastroesophageal reflux disease)   . Scoliosis   . Thyroid disease    Past Surgical History:  Procedure Laterality Date  . APPENDECTOMY  1972  . CATARACT EXTRACTION, BILATERAL  October and December 2017   Dr. Antionette Fairy  . KNEE SURGERY     RIGHT  . TONSILLECTOMY  1998   Family History  Problem Relation Age of  Onset  . Stroke Father 56  . Heart disease Brother 33       MI  . Heart disease Cousin 74       MI   Social History   Socioeconomic History  . Marital status: Single    Spouse name: Not on file  . Number of children: Not on file  . Years of education: Not on file  . Highest education level: Not on file  Occupational History  . Occupation: Editor, commissioning , [employer]  Social Needs  . Financial resource strain: Not on file  . Food insecurity:    Worry: Not on file    Inability: Not on file  . Transportation needs:    Medical: Not on file    Non-medical: Not on file  Tobacco Use  . Smoking status: Never Smoker  . Smokeless tobacco: Never Used  Substance and Sexual Activity  . Alcohol use: Yes    Alcohol/week: 3.0 oz    Types: 5 Glasses of wine per week    Comment: Socially  . Drug use: No  . Sexual activity: Not Currently    Partners: Male  Lifestyle  . Physical activity:    Days per week: Not on file  Minutes per session: Not on file  . Stress: Not on file  Relationships  . Social connections:    Talks on phone: Not on file    Gets together: Not on file    Attends religious service: Not on file    Active member of club or organization: Not on file    Attends meetings of clubs or organizations: Not on file    Relationship status: Not on file  Other Topics Concern  . Not on file  Social History Narrative   Exercise-- 3x a week,  Walking, weights, ellipitical, bike        Outpatient Encounter Medications as of 04/19/2018  Medication Sig  . Calcium Carbonate Antacid (TUMS PO) Take 1 tablet by mouth as needed.  Marland Kitchen esomeprazole (NEXIUM) 40 MG capsule TAKE 1 CAPSULE(40 MG) BY MOUTH DAILY  . FLUoxetine (PROZAC) 40 MG capsule TAKE 2 CAPSULES(80 MG) BY MOUTH DAILY  . levothyroxine (SYNTHROID, LEVOTHROID) 75 MCG tablet TAKE 1 TABLET BY MOUTH DAILY  . [DISCONTINUED] MELATONIN PO Take 1 tablet by mouth at bedtime as needed.   Facility-Administered Encounter Medications as of  04/19/2018  Medication  . 0.9 %  sodium chloride infusion    Activities of Daily Living In your present state of health, do you have any difficulty performing the following activities: 04/19/2018  Hearing? N  Vision? N  Difficulty concentrating or making decisions? N  Walking or climbing stairs? N  Dressing or bathing? N  Doing errands, shopping? N  Preparing Food and eating ? N  Using the Toilet? N  In the past six months, have you accidently leaked urine? N  Do you have problems with loss of bowel control? N  Managing your Medications? N  Managing your Finances? N  Some recent data might be hidden    Patient Care Team: Carollee Herter, Alferd Apa, DO as PCP - General (Family Medicine) Shirl Harris, [place] as Consulting Physician (Optometry) Alycia Rossetti, DC as Consulting Physician (Chiropractic Medicine) Clide Cliff, DDS as Consulting Physician (Dentistry) Antionette Fairy, Isaias Cowman, MD as Consulting Physician (Ophthalmology)    Assessment:   This is a routine wellness examination for [place]. Physical assessment deferred to PCP.  Exercise Activities and Dietary recommendations Current Exercise Habits: Home exercise routine, Type of exercise: stretching, Time (Minutes): 30, Frequency (Times/Week): 7, Weekly Exercise (Minutes/Week): 210, Intensity: Mild, Exercise limited by: None identified Diet (meal preparation, eat out, water intake, caffeinated beverages, dairy products, fruits and vegetables): in general, a "healthy" diet  , well balanced Reports drinking 8 bottles per day      Goals    . Have back surgery and be recovered and more active. (pt-stated)       Fall Risk Fall Risk  04/19/2018 03/23/2017 02/24/2016 02/24/2016 01/08/2015  Falls in the past year? Yes No No No Yes  Number falls in past yr: 1 - - - 1  Injury with Fall? No - - - No  Follow up Education provided;Falls prevention discussed - - - (No Data)  Comment - - - - She has seen the Ortho   Depression Screen PHQ 2/9 Scores  04/19/2018 03/23/2017 02/24/2016 02/24/2016  PHQ - 2 Score 1 5 0 0  PHQ- 9 Score - 15 - -     Cognitive Function MMSE - Mini Mental State Exam 04/19/2018 03/23/2017  Orientation to time 5 5  Orientation to Place 5 5  Registration 3 3  Attention/ Calculation 5 5  Recall 1 3  Language- name  2 objects 2 2  Language- repeat 1 1  Language- follow 3 step command 3 3  Language- read & follow direction 1 1  Write a sentence 1 1  Copy design 1 0  Total score 28 29        Immunization History  Administered Date(s) Administered  . Influenza-Unspecified 08/19/2014  . Pneumococcal Conjugate-13 02/24/2016  . Pneumococcal Polysaccharide-23 12/25/2013  . Tdap 10/20/1999, 07/26/2012   Screening Tests Health Maintenance  Topic Date Due  . MAMMOGRAM  03/25/2018  . Hepatitis C Screening  03/29/2024 (Originally 09/03/1946)  . INFLUENZA VACCINE  05/19/2018  . Fecal DNA (Cologuard)  04/09/2020  . TETANUS/TDAP  07/26/2022  . DEXA SCAN  Completed  . PNA vac Low Risk Adult  Completed      Plan:     Please schedule your next medicare wellness visit with me in 1 yr.  Continue to eat heart healthy diet (full of fruits, vegetables, whole grains, Lowery protein, water--limit salt, fat, and sugar intake) and increase physical activity as tolerated.  Bring a copy of your living will and/or healthcare power of attorney to your next office visit.   I have personally reviewed and noted the following in the patient's chart:   . Medical and social history . Use of alcohol, tobacco or illicit drugs  . Current medications and supplements . Functional ability and status . Nutritional status . Physical activity . Advanced directives . List of other physicians . Hospitalizations, surgeries, and ER visits in previous 12 months . Vitals . Screenings to include cognitive, depression, and falls . Referrals and appointments  In addition, I have reviewed and discussed with patient certain preventive protocols,  quality metrics, and best practice recommendations. A written personalized care plan for preventive services as well as general preventive health recommendations were provided to patient.     Naaman Plummer [place], [place]  04/19/2018  Kathlene November, MD\

## 2018-04-19 ENCOUNTER — Ambulatory Visit (INDEPENDENT_AMBULATORY_CARE_PROVIDER_SITE_OTHER): Payer: Medicare Other | Admitting: *Deleted

## 2018-04-19 ENCOUNTER — Encounter: Payer: Self-pay | Admitting: *Deleted

## 2018-04-19 VITALS — BP 122/80 | HR 60 | Ht 66.0 in | Wt 184.6 lb

## 2018-04-19 DIAGNOSIS — Z1231 Encounter for screening mammogram for malignant neoplasm of breast: Secondary | ICD-10-CM | POA: Diagnosis not present

## 2018-04-19 DIAGNOSIS — Z0000 Encounter for general adult medical examination without abnormal findings: Secondary | ICD-10-CM | POA: Diagnosis not present

## 2018-04-19 DIAGNOSIS — Z1239 Encounter for other screening for malignant neoplasm of breast: Secondary | ICD-10-CM

## 2018-04-19 NOTE — Patient Instructions (Signed)
Please schedule your next medicare wellness visit with me in 1 yr.  Continue to eat heart healthy diet (full of fruits, vegetables, whole grains, lean protein, water--limit salt, fat, and sugar intake) and increase physical activity as tolerated.  Bring a copy of your living will and/or healthcare power of attorney to your next office visit.   Denise Lowery , Thank you for taking time to come for your Medicare Wellness Visit. I appreciate your ongoing commitment to your health goals. Please review the following plan we discussed and let me know if I can assist you in the future.   These are the goals we discussed: Goals    . Have back surgery and be recovered and more active. (pt-stated)       This is a list of the screening recommended for you and due dates:  Health Maintenance  Topic Date Due  . Mammogram  03/25/2018  .  Hepatitis C: One time screening is recommended by Center for Disease Control  (CDC) for  adults born from [birth year] through 1964.   03/29/2024*  . Flu Shot  05/19/2018  . Cologuard (Stool DNA test)  04/09/2020  . Tetanus Vaccine  07/26/2022  . DEXA scan (bone density measurement)  Completed  . Pneumonia vaccines  Completed  *Topic was postponed. The date shown is not the original due date.     Health Maintenance for Postmenopausal Women Menopause is a normal process in which your reproductive ability comes to an end. This process happens gradually over a span of months to years, usually between the ages of 46 and 7. Menopause is complete when you have missed 12 consecutive menstrual periods. It is important to talk with your health care provider about some of the most common conditions that affect postmenopausal women, such as heart disease, cancer, and bone loss (osteoporosis). Adopting a healthy lifestyle and getting preventive care can help to promote your health and wellness. Those actions can also lower your chances of developing some of these common conditions. What  should I know about menopause? During menopause, you may experience a number of symptoms, such as:  Moderate-to-severe hot flashes.  Night sweats.  Decrease in sex drive.  Mood swings.  Headaches.  Tiredness.  Irritability.  Memory problems.  Insomnia.  Choosing to treat or not to treat menopausal changes is an individual decision that you make with your health care provider. What should I know about hormone replacement therapy and supplements? Hormone therapy products are effective for treating symptoms that are associated with menopause, such as hot flashes and night sweats. Hormone replacement carries certain risks, especially as you become older. If you are thinking about using estrogen or estrogen with progestin treatments, discuss the benefits and risks with your health care provider. What should I know about heart disease and stroke? Heart disease, heart attack, and stroke become more likely as you age. This may be due, in part, to the hormonal changes that your body experiences during menopause. These can affect how your body processes dietary fats, triglycerides, and cholesterol. Heart attack and stroke are both medical emergencies. There are many things that you can do to help prevent heart disease and stroke:  Have your blood pressure checked at least every 1-2 years. High blood pressure causes heart disease and increases the risk of stroke.  If you are 100-11 years old, ask your health care provider if you should take aspirin to prevent a heart attack or a stroke.  Do not use any tobacco products,  including cigarettes, chewing tobacco, or electronic cigarettes. If you need help quitting, ask your health care provider.  It is important to eat a healthy diet and maintain a healthy weight. ? Be sure to include plenty of vegetables, fruits, low-fat dairy products, and lean protein. ? Avoid eating foods that are high in solid fats, added sugars, or salt (sodium).  Get  regular exercise. This is one of the most important things that you can do for your health. ? Try to exercise for at least 150 minutes each week. The type of exercise that you do should increase your heart rate and make you sweat. This is known as moderate-intensity exercise. ? Try to do strengthening exercises at least twice each week. Do these in addition to the moderate-intensity exercise.  Know your numbers.Ask your health care provider to check your cholesterol and your blood glucose. Continue to have your blood tested as directed by your health care provider.  What should I know about cancer screening? There are several types of cancer. Take the following steps to reduce your risk and to catch any cancer development as early as possible. Breast Cancer  Practice breast self-awareness. ? This means understanding how your breasts normally appear and feel. ? It also means doing regular breast self-exams. Let your health care provider know about any changes, no matter how small.  If you are 54 or older, have a clinician do a breast exam (clinical breast exam or CBE) every year. Depending on your age, family history, and medical history, it may be recommended that you also have a yearly breast X-ray (mammogram).  If you have a family history of breast cancer, talk with your health care provider about genetic screening.  If you are at high risk for breast cancer, talk with your health care provider about having an MRI and a mammogram every year.  Breast cancer (BRCA) gene test is recommended for women who have family members with BRCA-related cancers. Results of the assessment will determine the need for genetic counseling and BRCA1 and for BRCA2 testing. BRCA-related cancers include these types: ? Breast. This occurs in males or females. ? Ovarian. ? Tubal. This may also be called fallopian tube cancer. ? Cancer of the abdominal or pelvic lining (peritoneal  cancer). ? Prostate. ? Pancreatic.  Cervical, Uterine, and Ovarian Cancer Your health care provider may recommend that you be screened regularly for cancer of the pelvic organs. These include your ovaries, uterus, and vagina. This screening involves a pelvic exam, which includes checking for microscopic changes to the surface of your cervix (Pap test).  For women ages 21-65, health care providers may recommend a pelvic exam and a Pap test every three years. For women ages 57-65, they may recommend the Pap test and pelvic exam, combined with testing for human papilloma virus (HPV), every five years. Some types of HPV increase your risk of cervical cancer. Testing for HPV may also be done on women of any age who have unclear Pap test results.  Other health care providers may not recommend any screening for nonpregnant women who are considered low risk for pelvic cancer and have no symptoms. Ask your health care provider if a screening pelvic exam is right for you.  If you have had past treatment for cervical cancer or a condition that could lead to cancer, you need Pap tests and screening for cancer for at least 20 years after your treatment. If Pap tests have been discontinued for you, your risk factors (such  as having a new sexual partner) need to be reassessed to determine if you should start having screenings again. Some women have medical problems that increase the chance of getting cervical cancer. In these cases, your health care provider may recommend that you have screening and Pap tests more often.  If you have a family history of uterine cancer or ovarian cancer, talk with your health care provider about genetic screening.  If you have vaginal bleeding after reaching menopause, tell your health care provider.  There are currently no reliable tests available to screen for ovarian cancer.  Lung Cancer Lung cancer screening is recommended for adults 76-68 years old who are at high risk for  lung cancer because of a history of smoking. A yearly low-dose CT scan of the lungs is recommended if you:  Currently smoke.  Have a history of at least 30 pack-years of smoking and you currently smoke or have quit within the past 15 years. A pack-year is smoking an average of one pack of cigarettes per day for one year.  Yearly screening should:  Continue until it has been 15 years since you quit.  Stop if you develop a health problem that would prevent you from having lung cancer treatment.  Colorectal Cancer  This type of cancer can be detected and can often be prevented.  Routine colorectal cancer screening usually begins at age 56 and continues through age 8.  If you have risk factors for colon cancer, your health care provider may recommend that you be screened at an earlier age.  If you have a family history of colorectal cancer, talk with your health care provider about genetic screening.  Your health care provider may also recommend using home test kits to check for hidden blood in your stool.  A small camera at the end of a tube can be used to examine your colon directly (sigmoidoscopy or colonoscopy). This is done to check for the earliest forms of colorectal cancer.  Direct examination of the colon should be repeated every 5-10 years until age 34. However, if early forms of precancerous polyps or small growths are found or if you have a family history or genetic risk for colorectal cancer, you may need to be screened more often.  Skin Cancer  Check your skin from head to toe regularly.  Monitor any moles. Be sure to tell your health care provider: ? About any new moles or changes in moles, especially if there is a change in a mole's shape or color. ? If you have a mole that is larger than the size of a pencil eraser.  If any of your family members has a history of skin cancer, especially at a young age, talk with your health care provider about genetic  screening.  Always use sunscreen. Apply sunscreen liberally and repeatedly throughout the day.  Whenever you are outside, protect yourself by wearing long sleeves, pants, a wide-brimmed hat, and sunglasses.  What should I know about osteoporosis? Osteoporosis is a condition in which bone destruction happens more quickly than new bone creation. After menopause, you may be at an increased risk for osteoporosis. To help prevent osteoporosis or the bone fractures that can happen because of osteoporosis, the following is recommended:  If you are 75-33 years old, get at least 1,000 mg of calcium and at least 600 mg of vitamin D per day.  If you are older than age 39 but younger than age 46, get at least 1,200 mg of calcium  and at least 600 mg of vitamin D per day.  If you are older than age 88, get at least 1,200 mg of calcium and at least 800 mg of vitamin D per day.  Smoking and excessive alcohol intake increase the risk of osteoporosis. Eat foods that are rich in calcium and vitamin D, and do weight-bearing exercises several times each week as directed by your health care provider. What should I know about how menopause affects my mental health? Depression may occur at any age, but it is more common as you become older. Common symptoms of depression include:  Low or sad mood.  Changes in sleep patterns.  Changes in appetite or eating patterns.  Feeling an overall lack of motivation or enjoyment of activities that you previously enjoyed.  Frequent crying spells.  Talk with your health care provider if you think that you are experiencing depression. What should I know about immunizations? It is important that you get and maintain your immunizations. These include:  Tetanus, diphtheria, and pertussis (Tdap) booster vaccine.  Influenza every year before the flu season begins.  Pneumonia vaccine.  Shingles vaccine.  Your health care provider may also recommend other  immunizations. This information is not intended to replace advice given to you by your health care provider. Make sure you discuss any questions you have with your health care provider. Document Released: 11/27/2005 Document Revised: 04/24/2016 Document Reviewed: 07/09/2015 Elsevier Interactive Patient Education  2018 [employer].

## 2018-04-28 ENCOUNTER — Other Ambulatory Visit (HOSPITAL_COMMUNITY): Payer: Self-pay

## 2018-05-02 ENCOUNTER — Other Ambulatory Visit (HOSPITAL_COMMUNITY): Payer: Self-pay

## 2018-05-05 ENCOUNTER — Other Ambulatory Visit: Payer: Self-pay | Admitting: Family Medicine

## 2018-05-06 ENCOUNTER — Inpatient Hospital Stay: Admit: 2018-05-06 | Payer: Medicare Other | Admitting: Neurological Surgery

## 2018-05-06 SURGERY — LAMINECTOMY WITH POSTERIOR LATERAL ARTHRODESIS LEVEL 2
Anesthesia: General | Site: Back

## 2018-05-20 ENCOUNTER — Other Ambulatory Visit: Payer: Self-pay | Admitting: Family Medicine

## 2018-06-08 ENCOUNTER — Other Ambulatory Visit: Payer: Self-pay | Admitting: Family Medicine

## 2018-06-08 DIAGNOSIS — K219 Gastro-esophageal reflux disease without esophagitis: Secondary | ICD-10-CM

## 2018-08-23 ENCOUNTER — Ambulatory Visit (HOSPITAL_BASED_OUTPATIENT_CLINIC_OR_DEPARTMENT_OTHER): Payer: Self-pay

## 2018-08-30 ENCOUNTER — Ambulatory Visit (HOSPITAL_BASED_OUTPATIENT_CLINIC_OR_DEPARTMENT_OTHER): Payer: Self-pay

## 2018-09-06 ENCOUNTER — Ambulatory Visit (HOSPITAL_BASED_OUTPATIENT_CLINIC_OR_DEPARTMENT_OTHER): Payer: Self-pay

## 2018-09-22 ENCOUNTER — Telehealth: Payer: Self-pay | Admitting: *Deleted

## 2018-09-22 NOTE — Telephone Encounter (Signed)
Copied from Jud [id number]. Topic: General - Other >> Sep 21, 2018 12:01 PM Denise Lowery wrote: Reason for CRM: Patient would like to know when she had her last pneumonia vaccine?

## 2018-09-22 NOTE — Telephone Encounter (Signed)
Patient given dates of 2 pneumonia vaccines.  Prevnar 02/24/16 and Pneumovax 12/25/13

## 2018-10-06 ENCOUNTER — Ambulatory Visit: Payer: Self-pay | Admitting: Family Medicine

## 2018-10-06 ENCOUNTER — Telehealth: Payer: Self-pay | Admitting: *Deleted

## 2018-10-06 NOTE — Telephone Encounter (Signed)
Received Imaging Results & notes from [facility]; forwarded to provider/SLS 12/19

## 2018-10-10 ENCOUNTER — Ambulatory Visit: Payer: Medicare Other | Admitting: Family Medicine

## 2018-10-10 ENCOUNTER — Encounter: Payer: Self-pay | Admitting: Family Medicine

## 2018-10-10 ENCOUNTER — Encounter

## 2018-10-10 VITALS — BP 116/78 | HR 70 | Temp 98.3°F | Resp 16 | Ht 66.0 in | Wt 186.4 lb

## 2018-10-10 DIAGNOSIS — S62639A Displaced fracture of distal phalanx of unspecified finger, initial encounter for closed fracture: Secondary | ICD-10-CM

## 2018-10-10 DIAGNOSIS — Z4802 Encounter for removal of sutures: Secondary | ICD-10-CM

## 2018-10-10 NOTE — Patient Instructions (Signed)
Mallet Finger  Mallet finger is an injury that occurs when an object hits the tip of your straightened finger or thumb. It is also known as baseball finger. The blow to your fingertip causes it to bend more than normal, which tears the cord that attaches to the tip of your finger (extensor tendon).  Your extensor tendon is what straightens the end of your finger. If this tendon is damaged, you will not be able to straighten your fingertip. Sometimes, a piece of bone may be pulled away with the tendon (avulsion injury), or the tendon may tear completely. In some cases, surgery may be required to repair the damage. What are the causes? Mallet finger is caused by a hard, direct hit to the tip of your finger or thumb. This injury often happens from getting hit in the finger with a hard ball, such as a baseball. What increases the risk? This injury is more likely to happen if you play a sport that uses a hard ball. What are the signs or symptoms? The main symptom of this injury is the inability to straighten the tip of your finger. You can manually straighten your fingertip with your other hand, but the finger cannot straighten on its own. Other symptoms may include:  Pain.  Swelling.  Bruising.  Blood under the fingernail. How is this diagnosed? Your health care provider may suspect mallet finger if you are not able to extend your fingertip, especially if you recently injured your hand. Your health care provider will do a physical exam. This may include X-rays to see if a piece of bone has been pulled away or if the finger joint has separated (dislocated). How is this treated? Mallet finger may be treated with:  A splint on your fingertip to keep it straight (extended) while the tendon heals.  Surgery to repair the tendon. This is done in severe cases. This may involve: ? Using a pin or screw to keep your finger extended and your tendon attached. ? Using a piece of tendon from another part of  your body (graft) to replace a torn tendon. Follow these instructions at home: If you have a splint:  Wear the splint as told by your health care provider. Remove it only as told by your health care provider.  Loosen the splint if your fingers tingle, become numb, or turn cold and blue.  Keep the splint clean.  If the splint is not waterproof: ? Do not let it get wet. ? Cover it with a watertight covering when you take a bath or a shower.  If you take your splint off to dry it or change it: ? Gently press your finger on a flat surface to keep it straight. Failing to do so may lead to a permanent injury, or force you to wear the splint for a longer period of time. ? Check the skin under the splint. Tell your health care provider if you notice a blister or red and raw skin. Managing pain, stiffness, and swelling   If directed, put ice on the injured area: ? If you have a removable splint, remove it as told by your health care provider. ? Put ice in a plastic bag. ? Place a towel between your skin and the bag. ? Leave the ice on for 20 minutes, 2-3 times a day.  Move your fingers often to avoid stiffness and to lessen swelling.  Raise (elevate)the injured hand above the level of your heart while you are sitting or  Leave the ice on for 20 minutes, 2-3 times a day.   Move your fingers often to avoid stiffness and to lessen swelling.   Raise (elevate)the injured hand above the level of your heart while you are sitting or lying down.  General instructions   Take over-the-counter and prescription medicines only as told by your health care provider.   Do not drive or use heavy machinery while taking prescription pain medicine.   Keep all follow-up visits as told by your health care provider. This is important.  Contact a health care provider if:   You have pain or swelling that is getting worse.   Your finger feels cold.   You cannot extend your finger after treatment.   You notice that the skin under the splint is red, raw, or has a blister.  Get help right away if:   Even after loosening your splint, your finger is:  ? Very red and swollen.  ? White or blue.  ? Numb or  tingling.  Summary   Mallet finger is an injury that occurs from a hard, direct hit to the tip of your finger or thumb.   The blow to your fingertip causes it to bend more than normal, tearing the tendon that straightens the end of your finger. You cannot straighten your fingertip if this tendon is torn.   This injury often happens from getting hit in the finger with a hard ball, such as a baseball.   Treatment will depend on how severe the injury is. You may need to wear a splint to keep the finger straight while it heals. A more severe injury may require surgery to repair the tendon.  This information is not intended to replace advice given to you by your health care provider. Make sure you discuss any questions you have with your health care provider.  Document Released: 10/02/2000 Document Revised: 10/18/2017 Document Reviewed: 10/18/2017  Elsevier Interactive Patient Education  2019 [employer]

## 2018-10-10 NOTE — Progress Notes (Signed)
Patient ID: Denise Lowery, female    DOB: 06-17-1946  Age: 72 y.o. MRN: [id number]    Subjective:  Subjective  HPI Denise Lowery presents for R middle finger injury in [place] 12/6-- she reached up and pulled garage door down and her finger got caught on the door and cause a laceration. Xray at urgent care said ?avulsion fracture  She was put on bactrim and told to f/u 7-10 days for suture removal--- today is day 7.   Review of Systems  Constitutional: Negative for appetite change, diaphoresis, fatigue and unexpected weight change.  Eyes: Negative for pain, redness and visual disturbance.  Respiratory: Negative for cough, chest tightness, shortness of breath and wheezing.   Cardiovascular: Negative for chest pain, palpitations and leg swelling.  Endocrine: Negative for cold intolerance, heat intolerance, polydipsia, polyphagia and polyuria.  Genitourinary: Negative for difficulty urinating, dysuria and frequency.  Skin: Positive for wound.  Neurological: Negative for dizziness, light-headedness, numbness and headaches.    History Past Medical History:  Diagnosis Date  . Back pain   . Bronchitis   . Depression   . Environmental allergies   . GERD (gastroesophageal reflux disease)   . Scoliosis   . Thyroid disease     She has a past surgical history that includes Tonsillectomy (1998); Appendectomy (1972); Knee surgery; and Cataract extraction, bilateral (October and December 2017).   Her family history includes Heart disease (age of onset: 6) in her brother; Heart disease (age of onset: 22) in her cousin; Stroke (age of onset: 79) in her father.She reports that she has never smoked. She has never used smokeless tobacco. She reports current alcohol use of about 5.0 standard drinks of alcohol per week. She reports that she does not use drugs.  Current Outpatient Medications on File Prior to Visit  Medication Sig Dispense Refill  . Calcium Carbonate Antacid (TUMS PO) Take 1 tablet by  mouth as needed.    Marland Kitchen esomeprazole (NEXIUM) 40 MG capsule TAKE 1 CAPSULE(40 MG) BY MOUTH DAILY 90 capsule 1  . FLUoxetine (PROZAC) 40 MG capsule TAKE 2 CAPSULES(80 MG) BY MOUTH DAILY 180 capsule 1  . levothyroxine (SYNTHROID, LEVOTHROID) 75 MCG tablet TAKE 1 TABLET BY MOUTH DAILY 90 tablet 1   Current Facility-Administered Medications on File Prior to Visit  Medication Dose Route Frequency Provider Last Rate Last Dose  . 0.9 %  sodium chloride infusion  500 mL Intravenous Once Ladene Artist, MD         Objective:  Objective  Physical Exam Vitals signs and nursing note reviewed.  Constitutional:      Appearance: She is well-developed.  HENT:     Head: Normocephalic and atraumatic.  Eyes:     Conjunctiva/sclera: Conjunctivae normal.  Neck:     Musculoskeletal: Normal range of motion and neck supple.     Thyroid: No thyromegaly.     Vascular: No carotid bruit or JVD.  Cardiovascular:     Rate and Rhythm: Normal rate and regular rhythm.     Heart sounds: Normal heart sounds. No murmur.  Pulmonary:     Effort: Pulmonary effort is normal. No respiratory distress.     Breath sounds: Normal breath sounds. No wheezing or rales.  Chest:     Chest wall: No tenderness.  Skin:    Findings: Wound present.       Neurological:     Mental Status: She is alert and oriented to person, place, and time.     4 of the  6 sutures removed -- steri strips in place  BP 116/78 (BP Location: Left Arm, Cuff Size: Large)   Pulse 70   Temp 98.3 F (36.8 C) (Oral)   Resp 16   Ht 5\' 6"  (1.676 m)   Wt 186 lb 6.4 oz (84.6 kg)   SpO2 97%   BMI 30.09 kg/m  Wt Readings from Last 3 Encounters:  10/10/18 186 lb 6.4 oz (84.6 kg)  04/19/18 184 lb 9.6 oz (83.7 kg)  03/29/18 185 lb (83.9 kg)     Lab Results  Component Value Date   WBC 5.8 06/08/2017   HGB 12.9 06/08/2017   HCT 38.7 06/08/2017   PLT 289.0 06/08/2017   GLUCOSE 85 09/15/2017   CHOL 240 (H) 09/15/2017   TRIG 102.0 09/15/2017    HDL 67.10 09/15/2017   LDLCALC 153 (H) 09/15/2017   ALT 13 09/15/2017   AST 17 09/15/2017   NA 136 09/15/2017   K 4.0 09/15/2017   CL 101 09/15/2017   CREATININE 0.64 09/15/2017   BUN 21 09/15/2017   CO2 28 09/15/2017   TSH 1.43 06/08/2017    Ct Lumbar Spine Wo Contrast  Result Date: 03/25/2018 CLINICAL DATA:  Low back pain over the last 6 months. EXAM: CT LUMBAR SPINE WITHOUT CONTRAST TECHNIQUE: Multidetector CT imaging of the lumbar spine was performed without intravenous contrast administration. Multiplanar CT image reconstructions were also generated. COMPARISON:  MRI 10/20/2017 FINDINGS: Segmentation: 5 lumbar type vertebral bodies as numbered at previous MRI. Alignment: Thoracolumbar curvature convex to the right and lower lumbar curvature convex to the left. Degenerative anterolisthesis at L4-5 of 6 mm. One or 2 mm of anterolisthesis at L5-S1. Vertebrae: No fracture or primary lesion. Paraspinal and other soft tissues: Negative Disc levels: L1-2: Mild bulging of the disc. Mild facet osteoarthritis. No compressive stenosis. L2-3: Mild bulging of the disc. Mild facet hypertrophy. Mild narrowing of the lateral recesses without visible neural compression. L3-4: Mild bulging of the disc. Mild facet osteoarthritis. Mild narrowing of the lateral recesses without distinct neural compression. Abutment of the spinous processes with chronic bony changes that could be associated with back pain. L4-5: Advanced chronic facet arthropathy allowing anterolisthesis of 6 mm. Disc degeneration more pronounced on the right with bulging of the disc more towards the right. Multifactorial spinal stenosis at this level that could cause neural compression on either or both sides. Foraminal narrowing on the right that could affect the exiting L4 nerve. L5-S1: Facet osteoarthritis with 1 or 2 mm of anterolisthesis. Mild bulging of the disc. Mild stenosis of the subarticular lateral recess on the right without definite  neural compression. Foraminal narrowing on the right that would have some potential to affect the exiting right L5 nerve. Sacroiliac osteoarthritis right more than left. IMPRESSION: Thoracolumbar curvature convex to the right and lower lumbar curvature convex to the left. Advanced chronic facet arthropathy at L4-5 with 6 mm of anterolisthesis. Bulging of the disc more towards the right. Spinal stenosis that could cause neural compression on either or both sides. Foraminal narrowing on the right as well. L5-S1 facet osteoarthritis with 1 or 2 mm of anterolisthesis. Bulging of the disc. Mild narrowing of the right foramen and the subarticular lateral recess on the right. Abutment changes of the spinous processes of L3 and L4 which could be associated with back pain. Sacroiliac osteoarthritis right more than left which could be associated with pain. Electronically Signed   By: Nelson Chimes M.D.   On: 03/25/2018 07:07  Assessment & Plan:  Plan  I am having Denise Lowery maintain her Calcium Carbonate Antacid (TUMS PO), FLUoxetine, levothyroxine, and esomeprazole. We will continue to administer sodium chloride.  No orders of the defined types were placed in this encounter.   Problem List Items Addressed This Visit    None    Visit Diagnoses    Closed avulsion fracture of distal phalanx of finger, initial encounter    -  Primary   Relevant Orders   Ambulatory referral to Orthopedic Surgery    4/6 sutures removed Pt has a lot of pain and keeps hitting the finger Finger splinted and pt has app with ortho PA tomorrow morning-- -[facility]  Follow-up: No follow-ups on file.  Ann Held, DO

## 2018-10-11 DIAGNOSIS — S62639A Displaced fracture of distal phalanx of unspecified finger, initial encounter for closed fracture: Secondary | ICD-10-CM | POA: Insufficient documentation

## 2018-10-20 ENCOUNTER — Telehealth: Payer: Self-pay | Admitting: Family Medicine

## 2018-10-20 ENCOUNTER — Ambulatory Visit (HOSPITAL_BASED_OUTPATIENT_CLINIC_OR_DEPARTMENT_OTHER)
Admission: RE | Admit: 2018-10-20 | Discharge: 2018-10-20 | Disposition: A | Discharge status: Home / Self Care | Payer: Medicare Other | Source: Ambulatory Visit | Attending: Medical | Admitting: Medical

## 2018-10-20 ENCOUNTER — Encounter: Payer: Self-pay | Admitting: Medical

## 2018-10-20 ENCOUNTER — Ambulatory Visit: Payer: Medicare Other | Admitting: Medical

## 2018-10-20 VITALS — BP 111/82 | HR 69 | Temp 98.4°F | Resp 16 | Ht 66.0 in | Wt 183.0 lb

## 2018-10-20 DIAGNOSIS — J40 Bronchitis, not specified as acute or chronic: Secondary | ICD-10-CM | POA: Diagnosis not present

## 2018-10-20 DIAGNOSIS — R062 Wheezing: Secondary | ICD-10-CM

## 2018-10-20 DIAGNOSIS — R059 Cough, unspecified: Secondary | ICD-10-CM

## 2018-10-20 DIAGNOSIS — R05 Cough: Secondary | ICD-10-CM | POA: Insufficient documentation

## 2018-10-20 MED ORDER — HYDROCODONE-HOMATROPINE 5-1.5 MG/5ML PO SYRP: 5.0000 mL | ORAL_SOLUTION | Freq: Four times a day (QID) | ORAL | 0 refills | Status: DC | PRN

## 2018-10-20 NOTE — Progress Notes (Signed)
Subjective:    Patient ID: Denise Lowery, female    DOB: [birth year], 73 y.o.   MRN: [id number]  HPI   Pt in for cough with chest congestion x 7 days. Occasionally will get colored mucus when she coughs. Pt felt some fever and chills on Sunday.   No nasal congestion. No sinus pressure.  Some occasional wheezing. If gets bronchitis in the past will wheeze.  At times not able to sleep due to cough.  Pt was treated at [facility] and they gave her doxycycline on 10-16-2018. Pt also got benzonatate. But she is still coughing.Also using proair every 6 hours. Pt used prednisone in past for wheezing when had bronchitis. Currently on day 4 of 6 day taper dose prescribed by [facility].. She feels like wheezing is less now.    Review of Systems  Constitutional: Negative for chills, fatigue and fever.  HENT: Negative for congestion, ear discharge, ear pain, postnasal drip and sinus pain.   Respiratory: Positive for cough and wheezing. Negative for chest tightness and shortness of breath.        Productive cough.  Cardiovascular: Negative for chest pain and palpitations.  Gastrointestinal: Negative for abdominal pain.  Musculoskeletal: Negative for arthralgias, back pain, joint swelling and neck stiffness.  Skin: Negative for rash.  Neurological: Negative for dizziness, light-headedness and headaches.  Hematological: Negative for adenopathy. Does not bruise/bleed easily.  Psychiatric/Behavioral: Negative for behavioral problems and confusion.    Past Medical History:  Diagnosis Date  . Back pain   . Bronchitis   . Depression   . Environmental allergies   . GERD (gastroesophageal reflux disease)   . Scoliosis   . Thyroid disease      Social History   Socioeconomic History  . Marital status: Single    Spouse name: Not on file  . Number of children: Not on file  . Years of education: Not on file  . Highest education level: Not on file  Occupational History  . Occupation: Editor, commissioning ,  [employer]  Social Needs  . Financial resource strain: Not on file  . Food insecurity:    Worry: Not on file    Inability: Not on file  . Transportation needs:    Medical: Not on file    Non-medical: Not on file  Tobacco Use  . Smoking status: Never Smoker  . Smokeless tobacco: Never Used  Substance and Sexual Activity  . Alcohol use: Yes    Alcohol/week: 5.0 standard drinks    Types: 5 Glasses of wine per week    Comment: Socially  . Drug use: No  . Sexual activity: Not Currently    Partners: Male  Lifestyle  . Physical activity:    Days per week: Not on file    Minutes per session: Not on file  . Stress: Not on file  Relationships  . Social connections:    Talks on phone: Not on file    Gets together: Not on file    Attends religious service: Not on file    Active member of club or organization: Not on file    Attends meetings of clubs or organizations: Not on file    Relationship status: Not on file  . Intimate partner violence:    Fear of current or ex partner: Not on file    Emotionally abused: Not on file    Physically abused: Not on file    Forced sexual activity: Not on file  Other Topics Concern  .  Not on file  Social History Narrative   Exercise-- 3x a week,  Walking, weights, ellipitical, bike        Past Surgical History:  Procedure Laterality Date  . APPENDECTOMY  1972  . CATARACT EXTRACTION, BILATERAL  October and December 2017   Dr. Antionette Fairy  . KNEE SURGERY     RIGHT  . TONSILLECTOMY  1998    Family History  Problem Relation Age of Onset  . Stroke Father 19  . Heart disease Brother 62       MI  . Heart disease Cousin 42       MI    Allergies  Allergen Reactions  . Penicillins Itching and Swelling    Swelling of lips Itching on palms of hand    Current Outpatient Medications on File Prior to Visit  Medication Sig Dispense Refill  . albuterol (PROAIR HFA) 108 (90 Base) MCG/ACT inhaler ProAir HFA 90 mcg/actuation aerosol inhaler  INL 1  TO 2 PFS PO Q 4 TO 6 H PRN    . benzonatate (TESSALON) 100 MG capsule benzonatate 100 mg capsule  TK 1 C PO TID PRF COUGH    . Calcium Carbonate Antacid (TUMS PO) Take 1 tablet by mouth as needed.    . doxycycline (VIBRAMYCIN) 100 MG capsule Take 100 mg by mouth 2 (two) times daily.    Marland Kitchen esomeprazole (NEXIUM) 40 MG capsule TAKE 1 CAPSULE(40 MG) BY MOUTH DAILY 90 capsule 1  . FLUoxetine (PROZAC) 40 MG capsule TAKE 2 CAPSULES(80 MG) BY MOUTH DAILY 180 capsule 1  . levothyroxine (SYNTHROID, LEVOTHROID) 75 MCG tablet TAKE 1 TABLET BY MOUTH DAILY 90 tablet 1  . predniSONE (DELTASONE) 5 MG tablet Take 5 mg by mouth daily with breakfast.     Current Facility-Administered Medications on File Prior to Visit  Medication Dose Route Frequency Provider Last Rate Last Dose  . 0.9 %  sodium chloride infusion  500 mL Intravenous Once Ladene Artist, MD        BP 111/82   Pulse 69   Temp 98.4 F (36.9 C) (Oral)   Resp 16   Ht 5\' 6"  (1.676 m)   Wt 183 lb (83 kg)   SpO2 98%   BMI 29.54 kg/m       Objective:   Physical Exam  General  Mental Status - Alert. General Appearance - Well groomed. Not in acute distress.  Skin Rashes- No Rashes.  HEENT Head- Normal. Ear Auditory Canal - Left- Normal. Right - Normal.Tympanic Membrane- Left- Normal. Right- Normal. Eye Sclera/Conjunctiva- Left- Normal. Right- Normal. Nose & Sinuses Nasal Mucosa- Left-  Boggy and Congested. Right-  Boggy and  Congested.Bilateral no maxillary and  No frontal sinus pressure. Mouth & Throat Lips: Upper Lip- Normal: no dryness, cracking, pallor, cyanosis, or vesicular eruption. Lower Lip-Normal: no dryness, cracking, pallor, cyanosis or vesicular eruption. Buccal Mucosa- Bilateral- No Aphthous ulcers. Oropharynx- No Discharge or Erythema. Tonsils: Characteristics- Bilateral- No Erythema or Congestion. =pnd. Size/Enlargement- Bilateral- No enlargement. Discharge- bilateral-None.  Neck Neck- Supple. No  Masses.   Chest and Lung Exam Auscultation: Breath Sounds:-Clear even and unlabored.  Cardiovascular Auscultation:Rythm- Regular, rate and rhythm. Murmurs & Other Heart Sounds:Ausculatation of the heart reveal- No Murmurs.  Lymphatic Head & Neck General Head & Neck Lymphatics: Bilateral: Description- No Localized lymphadenopathy.       Assessment & Plan:  You appear to have bronchitis. Rest hydrate and tylenol for fever. I am prescribing cough medicine hycodan.  Advised that you  continue doxycycline presently until we get chest x-ray result back.  If x-ray were to show pneumonia then may switch you to a different antibiotic.  If no pneumonia seen then it is likely early in illness and it may take a couple more days to start feeling better.   For severe cough, stop benzonatate and I prescribed Hycodan.  Rx advisement given.  Also advised that she start Robitussin-DM.  For wheezing, continue taper prednisone course and use albuterol 2 puffs every 6 hours if needed for wheezing.  May need to extend prednisone when you finish current taper course. Let us know how you are doing.  Follow up in 7-10 days or as needed  [employer], [employer]

## 2018-10-20 NOTE — Patient Instructions (Addendum)
You appear to have bronchitis. Rest hydrate and tylenol for fever. I am prescribing cough medicine hycodan.  Advised that you continue doxycycline presently until we get chest x-ray result back.  If x-ray were to show pneumonia then may switch you to a different antibiotic.  If no pneumonia seen then it is likely early in illness and it may take a couple more days to start feeling better.   For severe cough, stop benzonatate and I prescribed Hycodan.  Rx advisement given.  Also advised that she start Robitussin-DM.  For wheezing, continue taper prednisone course and use albuterol 2 puffs every 6 hours if needed for wheezing.  May need to extend prednisone when you finish current taper course. Let us know how you are doing.  Follow up in 7-10 days or as needed

## 2018-10-20 NOTE — Telephone Encounter (Signed)
Copied from [place] [phone]. Topic: Quick Communication - See Telephone Encounter >> Oct 20, 2018  3:42 PM Blase Mess A wrote: CRM for notification. See Telephone encounter for: 10/20/18.  Patient is calling to she if the script benzonatate (TESSALON) 100 MG capsule [[id number]]  can be sent to [facility] [address] [place], [place] [address] [phone]  The [facility] [place] did not have the medication. Please advise thank you.

## 2018-10-20 NOTE — Telephone Encounter (Signed)
Copied from [place] [phone] [id number]. Topic: Quick Communication - See Telephone Encounter >> Oct 20, 2018  3:40 PM Denise Lowery wrote: CRM for notification. See Telephone encounter for: 10/20/18.  Patient is calling for her imaging results please advise

## 2018-10-21 ENCOUNTER — Other Ambulatory Visit: Payer: Self-pay

## 2018-10-21 DIAGNOSIS — R059 Cough, unspecified: Secondary | ICD-10-CM

## 2018-10-21 DIAGNOSIS — R05 Cough: Secondary | ICD-10-CM

## 2018-10-21 MED ORDER — BENZONATATE 100 MG PO CAPS: ORAL_CAPSULE | ORAL | 0 refills | Status: DC

## 2018-10-21 NOTE — Telephone Encounter (Signed)
Pt. Calling upset that she had not heard about her CXR results, and had not received her rx for tessalon. Per P[facility], pt. stated she needed the tessalon sent to [facility]. Author instructed PEC to relay notes left by Percell Miller on 1/2 regarding imaging results, and to let her know that we will resend tessalon to preferred pharmacy. Med order left pended for Edward's or PCP's approval.

## 2018-10-21 NOTE — Telephone Encounter (Signed)
Medication written on 10/21/18 to be sent to requested pharmacy

## 2018-10-21 NOTE — Telephone Encounter (Signed)
I prescribed hycodan. I can't send in hycodan electronically unless I know that a pharmacy has her paper rx. Will ask pharmacy to shred that rx. Then I can send in hycodan electonically.

## 2018-10-24 NOTE — Telephone Encounter (Signed)
Pt notified of results

## 2018-10-29 ENCOUNTER — Other Ambulatory Visit: Payer: Self-pay | Admitting: Family Medicine

## 2018-11-19 ENCOUNTER — Other Ambulatory Visit: Payer: Self-pay | Admitting: Family Medicine

## 2018-11-21 ENCOUNTER — Ambulatory Visit (HOSPITAL_BASED_OUTPATIENT_CLINIC_OR_DEPARTMENT_OTHER): Payer: Self-pay

## 2018-11-23 DIAGNOSIS — M79644 Pain in right finger(s): Secondary | ICD-10-CM | POA: Insufficient documentation

## 2018-12-05 ENCOUNTER — Other Ambulatory Visit: Payer: Self-pay | Admitting: Family Medicine

## 2018-12-05 ENCOUNTER — Ambulatory Visit (HOSPITAL_BASED_OUTPATIENT_CLINIC_OR_DEPARTMENT_OTHER): Payer: Self-pay

## 2018-12-05 DIAGNOSIS — K219 Gastro-esophageal reflux disease without esophagitis: Secondary | ICD-10-CM

## 2019-02-16 ENCOUNTER — Telehealth: Payer: Self-pay | Admitting: Family Medicine

## 2019-02-16 NOTE — Telephone Encounter (Signed)
Copied from [place] [id number]. 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 No - change in dose/medication Preferred Pharmacy (with phone number or street name): [id number] - [place], [place] - [address] AT [address] [phone] (Phone) [phone] (Fax)

## 2019-02-17 ENCOUNTER — Encounter: Payer: Self-pay | Admitting: Family Medicine

## 2019-02-17 ENCOUNTER — Other Ambulatory Visit: Payer: Self-pay

## 2019-02-17 ENCOUNTER — Ambulatory Visit (INDEPENDENT_AMBULATORY_CARE_PROVIDER_SITE_OTHER): Payer: Medicare Other | Admitting: Family Medicine

## 2019-02-17 VITALS — BP 134/84 | HR 62 | Temp 99.1°F | Ht 66.0 in | Wt 188.8 lb

## 2019-02-17 DIAGNOSIS — F321 Major depressive disorder, single episode, moderate: Secondary | ICD-10-CM | POA: Diagnosis not present

## 2019-02-17 MED ORDER — BUPROPION HCL ER (XL) 150 MG PO TB24: 150.0000 mg | ORAL_TABLET | Freq: Every day | ORAL | 2 refills | Status: DC

## 2019-02-17 NOTE — Telephone Encounter (Signed)
Patient set up for appointment for this afternoon at 3pm

## 2019-02-17 NOTE — Progress Notes (Signed)
Virtual Visit via Video Note  I connected with Denise Lowery on 02/17/19 at  3:00 PM EDT by a video enabled telemedicine application and verified that I am speaking with the correct person using two identifiers.  Location: Patient: home Provider: office   I discussed the limitations of evaluation and management by telemedicine and the availability of in person appointments. The patient expressed understanding and agreed to proceed.  History of Present Illness:   pt is home c/o worsening depression .  She tried taking 3 prozac and she thought it helped.  She denies feeling suicidal  Observations/Objective: See vital signes Vitals:   02/17/19 1457  Weight: 188 lb 12.8 oz (85.6 kg)  Height: 5\' 6"  (1.676 m)   Pt is in NAD,  Frustrated but not tearful No fevers  Assessment and Plan: 1. Depression, major, single episode, moderate (HCC) con't prozac-- -add wellbutrin and f/u in 1 month or sooner prn  - buPROPion (WELLBUTRIN XL) 150 MG 24 hr tablet; Take 1 tablet (150 mg total) by mouth daily.  Dispense: 30 tablet; Refill: 2 Follow Up Instructions:    I discussed the assessment and treatment plan with the patient. The patient was provided an opportunity to ask questions and all were answered. The patient agreed with the plan and demonstrated an understanding of the instructions.   The patient was advised to call back or seek an in-person evaluation if the symptoms worsen or if the condition fails to improve as anticipated.  I provided 25 minutes of non-face-to-face time during this encounter.   Ann Held, DO

## 2019-03-31 ENCOUNTER — Encounter: Payer: Self-pay | Admitting: Family Medicine

## 2019-04-20 NOTE — Progress Notes (Deleted)
Subjective:   Denise Lowery is a 73 y.o. female who presents for Medicare Annual (Subsequent) preventive examination.  Review of Systems:  ***       Objective:     Vitals: There were no vitals taken for this visit.  There is no height or weight on file to calculate BMI.  Advanced Directives 04/19/2018 03/23/2017 02/24/2016  Does Patient Have a Medical Advance Directive? Yes No Yes  Type of Paramedic of [place];Living will - [place];Living will  Does patient want to make changes to medical advance directive? No - Patient declined No - Patient declined No - Patient declined  Copy of [place] in Chart? No - copy requested - No - copy requested    Tobacco Social History   Tobacco Use  Smoking Status Never Smoker  Smokeless Tobacco Never Used     Counseling given: Not Answered   Clinical Intake:                       Past Medical History:  Diagnosis Date  . Back pain   . Bronchitis   . Depression   . Environmental allergies   . GERD (gastroesophageal reflux disease)   . Scoliosis   . Thyroid disease    Past Surgical History:  Procedure Laterality Date  . APPENDECTOMY  1972  . CATARACT EXTRACTION, BILATERAL  October and December 2017   Dr. Antionette Fairy  . KNEE SURGERY     RIGHT  . TONSILLECTOMY  1998   Family History  Problem Relation Age of Onset  . Stroke Father 15  . Heart disease Brother 9       MI  . Heart disease Cousin 74       MI   Social History   Socioeconomic History  . Marital status: Single    Spouse name: Not on file  . Number of children: Not on file  . Years of education: Not on file  . Highest education level: Not on file  Occupational History  . Occupation: Editor, commissioning , [employer]  Social Needs  . Financial resource strain: Not on file  . Food insecurity    Worry: Not on file    Inability: Not on file  . Transportation needs    Medical: Not on file   Non-medical: Not on file  Tobacco Use  . Smoking status: Never Smoker  . Smokeless tobacco: Never Used  Substance and Sexual Activity  . Alcohol use: Yes    Alcohol/week: 5.0 standard drinks    Types: 5 Glasses of wine per week    Comment: Socially  . Drug use: No  . Sexual activity: Not Currently    Partners: Male  Lifestyle  . Physical activity    Days per week: Not on file    Minutes per session: Not on file  . Stress: Not on file  Relationships  . Social Herbalist on phone: Not on file    Gets together: Not on file    Attends religious service: Not on file    Active member of club or organization: Not on file    Attends meetings of clubs or organizations: Not on file    Relationship status: Not on file  Other Topics Concern  . Not on file  Social History Narrative   Exercise-- 3x a week,  Walking, weights, ellipitical, bike        Outpatient Encounter Medications  as of 04/24/2019  Medication Sig  . albuterol (PROAIR HFA) 108 (90 Base) MCG/ACT inhaler ProAir HFA 90 mcg/actuation aerosol inhaler  INL 1 TO 2 PFS PO Q 4 TO 6 H PRN  . buPROPion (WELLBUTRIN XL) 150 MG 24 hr tablet Take 1 tablet (150 mg total) by mouth daily.  . Calcium Carbonate Antacid (TUMS PO) Take 1 tablet by mouth as needed.  Marland Kitchen esomeprazole (NEXIUM) 40 MG capsule TAKE 1 CAPSULE(40 MG) BY MOUTH DAILY  . FLUoxetine (PROZAC) 40 MG capsule TAKE 2 CAPSULES(80 MG) BY MOUTH DAILY  . levothyroxine (SYNTHROID, LEVOTHROID) 75 MCG tablet TAKE 1 TABLET BY MOUTH DAILY   Facility-Administered Encounter Medications as of 04/24/2019  Medication  . 0.9 %  sodium chloride infusion    Activities of Daily Living No flowsheet data found.  Patient Care Team: Carollee Herter, Alferd Apa, DO as PCP - General (Family Medicine) Shirl Harris, [place] as Consulting Physician (Optometry) Alycia Rossetti, DC as Consulting Physician (Chiropractic Medicine) Clide Cliff, DDS as Consulting Physician (Dentistry) Antionette Fairy,  Isaias Cowman, MD as Consulting Physician (Ophthalmology)    Assessment:   This is a routine wellness examination for [place].  Exercise Activities and Dietary recommendations    Goals    . Have 3 meals a day    . Have back surgery and be recovered and more active. (pt-stated)       Fall Risk Fall Risk  04/19/2018 03/23/2017 02/24/2016 02/24/2016 01/08/2015  Falls in the past year? Yes No No No Yes  Number falls in past yr: 1 - - - 1  Injury with Fall? No - - - No  Follow up Education provided;Falls prevention discussed - - - (No Data)  Comment - - - - She has seen the Ortho   Is the patient's home free of loose throw rugs in walkways, pet beds, electrical cords, etc?   {Blank single:19197::"yes","no"}      Grab bars in the bathroom? {Blank single:19197::"yes","no"}      Handrails on the stairs?   {Blank single:19197::"yes","no"}      Adequate lighting?   {Blank single:19197::"yes","no"}  Timed Get Up and Go performed: ***  Depression Screen PHQ 2/9 Scores 02/17/2019 04/19/2018 03/23/2017 02/24/2016  PHQ - 2 Score 2 1 5  0  PHQ- 9 Score 7 - 15 -     Cognitive Function MMSE - Mini Mental State Exam 04/19/2018 03/23/2017  Orientation to time 5 5  Orientation to Place 5 5  Registration 3 3  Attention/ Calculation 5 5  Recall 1 3  Language- name 2 objects 2 2  Language- repeat 1 1  Language- follow 3 step command 3 3  Language- read & follow direction 1 1  Write a sentence 1 1  Copy design 1 0  Total score 28 29        Immunization History  Administered Date(s) Administered  . Influenza-Unspecified 08/19/2014  . Pneumococcal Conjugate-13 02/24/2016  . Pneumococcal Polysaccharide-23 12/25/2013  . Tdap 10/20/1999, 07/26/2012    Qualifies for Shingles Vaccine?***  Screening Tests Health Maintenance  Topic Date Due  . MAMMOGRAM  03/25/2018  . Hepatitis C Screening  03/29/2024 (Originally Dec 26, 1945)  . INFLUENZA VACCINE  05/20/2019  . Fecal DNA (Cologuard)  04/09/2020  . TETANUS/TDAP   07/26/2022  . DEXA SCAN  Completed  . PNA vac Low Risk Adult  Completed    Cancer Screenings: Lung: Low Dose CT Chest recommended if Age 84-80 years, 30 pack-year currently smoking OR have quit w/in 15years.  Patient {DOES NOT does:27190::"does not"} qualify. Breast:  Up to date on Mammogram? {Yes/No:30480221}   Up to date of Bone Density/Dexa? {Yes/No:30480221} Colorectal: ***  Additional Screenings: ***: Hepatitis C Screening:      Plan:   ***   I have personally reviewed and noted the following in the patient's chart:   . Medical and social history . Use of alcohol, tobacco or illicit drugs  . Current medications and supplements . Functional ability and status . Nutritional status . Physical activity . Advanced directives . List of other physicians . Hospitalizations, surgeries, and ER visits in previous 12 months . Vitals . Screenings to include cognitive, depression, and falls . Referrals and appointments  In addition, I have reviewed and discussed with patient certain preventive protocols, quality metrics, and best practice recommendations. A written personalized care plan for preventive services as well as general preventive health recommendations were provided to patient.     Naaman Plummer [place], [place]  04/20/2019

## 2019-04-23 ENCOUNTER — Other Ambulatory Visit: Payer: Self-pay | Admitting: Family Medicine

## 2019-04-24 ENCOUNTER — Ambulatory Visit: Payer: Self-pay | Admitting: *Deleted

## 2019-06-05 ENCOUNTER — Other Ambulatory Visit: Payer: Self-pay

## 2019-06-05 ENCOUNTER — Ambulatory Visit (INDEPENDENT_AMBULATORY_CARE_PROVIDER_SITE_OTHER): Payer: Medicare Other | Admitting: Family Medicine

## 2019-06-05 ENCOUNTER — Encounter (HOSPITAL_BASED_OUTPATIENT_CLINIC_OR_DEPARTMENT_OTHER): Payer: Self-pay

## 2019-06-05 ENCOUNTER — Encounter: Payer: Self-pay | Admitting: Family Medicine

## 2019-06-05 ENCOUNTER — Ambulatory Visit (HOSPITAL_BASED_OUTPATIENT_CLINIC_OR_DEPARTMENT_OTHER)
Admission: RE | Admit: 2019-06-05 | Discharge: 2019-06-05 | Disposition: A | Discharge status: Home / Self Care | Payer: Medicare Other | Source: Ambulatory Visit | Attending: Internal Medicine | Admitting: Internal Medicine

## 2019-06-05 VITALS — BP 127/80 | HR 62 | Temp 97.8°F | Resp 18 | Ht 65.0 in | Wt 185.2 lb

## 2019-06-05 DIAGNOSIS — E039 Hypothyroidism, unspecified: Secondary | ICD-10-CM | POA: Diagnosis not present

## 2019-06-05 DIAGNOSIS — E785 Hyperlipidemia, unspecified: Secondary | ICD-10-CM | POA: Diagnosis not present

## 2019-06-05 DIAGNOSIS — Z1231 Encounter for screening mammogram for malignant neoplasm of breast: Secondary | ICD-10-CM | POA: Insufficient documentation

## 2019-06-05 DIAGNOSIS — Z1239 Encounter for other screening for malignant neoplasm of breast: Secondary | ICD-10-CM

## 2019-06-05 DIAGNOSIS — Z0001 Encounter for general adult medical examination with abnormal findings: Secondary | ICD-10-CM

## 2019-06-05 DIAGNOSIS — F419 Anxiety disorder, unspecified: Secondary | ICD-10-CM

## 2019-06-05 DIAGNOSIS — Z0000 Encounter for general adult medical examination without abnormal findings: Secondary | ICD-10-CM

## 2019-06-05 DIAGNOSIS — E669 Obesity, unspecified: Secondary | ICD-10-CM

## 2019-06-05 DIAGNOSIS — R5383 Other fatigue: Secondary | ICD-10-CM | POA: Diagnosis not present

## 2019-06-05 LAB — COMPREHENSIVE METABOLIC PANEL
ALT: 10 U/L (ref 0–35)
AST: 15 U/L (ref 0–37)
Albumin: 4.3 g/dL (ref 3.5–5.2)
Alkaline Phosphatase: 69 U/L (ref 39–117)
BUN: 19 mg/dL (ref 6–23)
CO2: 28 mEq/L (ref 19–32)
Calcium: 9.5 mg/dL (ref 8.4–10.5)
Chloride: 97 mEq/L (ref 96–112)
Creatinine, Ser: 0.74 mg/dL (ref 0.40–1.20)
GFR: 77.19 mL/min (ref >60.00)
Glucose, Bld: 83 mg/dL (ref 70–99)
Potassium: 4.9 mEq/L (ref 3.5–5.1)
Sodium: 132 mEq/L — ABNORMAL LOW (ref 135–145)
Total Bilirubin: 0.5 mg/dL (ref 0.2–1.2)
Total Protein: 6.7 g/dL (ref 6.0–8.3)

## 2019-06-05 LAB — LIPID PANEL
Cholesterol: 231 mg/dL — ABNORMAL HIGH (ref 0–200)
HDL: 61.3 mg/dL (ref >39.00)
LDL Cholesterol: 131 mg/dL — ABNORMAL HIGH (ref 0–99)
NonHDL: 169.4
Total CHOL/HDL Ratio: 4
Triglycerides: 192 mg/dL — ABNORMAL HIGH (ref 0.0–149.0)
VLDL: 38.4 mg/dL (ref 0.0–40.0)

## 2019-06-05 LAB — VITAMIN D 25 HYDROXY (VIT D DEFICIENCY, FRACTURES): VITD: 58.14 ng/mL (ref 30.00–100.00)

## 2019-06-05 LAB — TSH: TSH: 1.64 u[IU]/mL (ref 0.35–4.50)

## 2019-06-05 LAB — B12 AND FOLATE PANEL
Folate: >24.7 ng/mL (ref >5.9)
Vitamin B-12: 964 pg/mL — ABNORMAL HIGH (ref 211–911)

## 2019-06-05 MED ORDER — ESCITALOPRAM OXALATE 10 MG PO TABS: 10.0000 mg | ORAL_TABLET | Freq: Every day | ORAL | 3 refills | Status: DC

## 2019-06-05 NOTE — Progress Notes (Signed)
+ Subjective:     Denise Lowery is a 73 y.o. female and is here for a comprehensive physical exam. The patient reports problems - fattigue since she was sick in Jan.  she also needs labs and reffills on medication.  .  Social History   Socioeconomic History  . Marital status: Single    Spouse name: Not on file  . Number of children: Not on file  . Years of education: Not on file  . Highest education level: Not on file  Occupational History  . Occupation: Editor, commissioning , [employer]  Social Needs  . Financial resource strain: Not on file  . Food insecurity    Worry: Not on file    Inability: Not on file  . Transportation needs    Medical: Not on file    Non-medical: Not on file  Tobacco Use  . Smoking status: Never Smoker  . Smokeless tobacco: Never Used  Substance and Sexual Activity  . Alcohol use: Yes    Alcohol/week: 5.0 standard drinks    Types: 5 Glasses of wine per week    Comment: Socially  . Drug use: No  . Sexual activity: Not Currently    Partners: Male  Lifestyle  . Physical activity    Days per week: Not on file    Minutes per session: Not on file  . Stress: Not on file  Relationships  . Social Herbalist on phone: Not on file    Gets together: Not on file    Attends religious service: Not on file    Active member of club or organization: Not on file    Attends meetings of clubs or organizations: Not on file    Relationship status: Not on file  . Intimate partner violence    Fear of current or ex partner: Not on file    Emotionally abused: Not on file    Physically abused: Not on file    Forced sexual activity: Not on file  Other Topics Concern  . Not on file  Social History Narrative   Exercise-- 3x a week,  Walking, weights, ellipitical, bike       Health Maintenance  Topic Date Due  . MAMMOGRAM   03/25/2018  . INFLUENZA VACCINE  05/20/2019  . Hepatitis C Screening  03/29/2024 (Originally 1946/05/23)  . Fecal DNA (Cologuard)  04/09/2020  . TETANUS/TDAP  07/26/2022  . DEXA SCAN  Completed  . PNA vac Low Risk Adult  Completed    The following portions of the patient's history were reviewed and updated as appropriate:  She  has a past medical history of Back pain, Bronchitis, Depression, Environmental allergies, GERD (gastroesophageal reflux disease), Scoliosis, and Thyroid disease. She does not have any pertinent problems on file. She  has a past surgical history that includes Tonsillectomy (1998); Appendectomy (1972); Knee surgery; and Cataract extraction, bilateral (October and December 2017). Her family history includes Heart disease (age of onset: 24) in her brother; Heart disease (age of onset: 80) in her cousin; Stroke (age of onset: 33)  in her father. She  reports that she has never smoked. She has never used smokeless tobacco. She reports current alcohol use of about 5.0 standard drinks of alcohol per week. She reports that she does not use drugs. She has a current medication list which includes the following prescription(s): albuterol, bupropion, calcium carbonate antacid, esomeprazole, fluoxetine, and levothyroxine, and the following Facility-Administered Medications: sodium chloride. Current Outpatient Medications on File Prior to Visit  Medication Sig Dispense Refill  . albuterol (PROAIR HFA) 108 (90 Base) MCG/ACT inhaler ProAir HFA 90 mcg/actuation aerosol inhaler  INL 1 TO 2 PFS PO Q 4 TO 6 H PRN    . buPROPion (WELLBUTRIN XL) 150 MG 24 hr tablet Take 1 tablet (150 mg total) by mouth daily. 30 tablet 2  . Calcium Carbonate Antacid (TUMS PO) Take 1 tablet by mouth as needed.    Marland Kitchen esomeprazole (NEXIUM) 40 MG capsule TAKE 1 CAPSULE(40 MG) BY MOUTH DAILY 90 capsule 1  . FLUoxetine (PROZAC) 40 MG capsule TAKE 2 CAPSULES(80 MG) BY MOUTH DAILY 180 capsule 1  . levothyroxine  (SYNTHROID) 75 MCG tablet TAKE 1 TABLET BY MOUTH DAILY 90 tablet 1   Current Facility-Administered Medications on File Prior to Visit  Medication Dose Route Frequency Provider Last Rate Last Dose  . 0.9 %  sodium chloride infusion  500 mL Intravenous Once Ladene Artist, MD       She is allergic to penicillins..  Review of Systems Review of Systems  Constitutional: Negative for activity change, appetite change and fatigue.  HENT: Negative for hearing loss, congestion, tinnitus and ear discharge.  dentist q58m Eyes: Negative for visual disturbance (see optho q1y -- vision corrected to 20/20 with glasses).  Respiratory: Negative for cough, chest tightness and shortness of breath.   Cardiovascular: Negative for chest pain, palpitations and leg swelling.  Gastrointestinal: Negative for abdominal pain, diarrhea, constipation and abdominal distention.  Genitourinary: Negative for urgency, frequency, decreased urine volume and difficulty urinating.  Musculoskeletal: Negative for back pain, arthralgias and gait problem.  Skin: Negative for color change, pallor and rash.  Neurological: Negative for dizziness, light-headedness, numbness and headaches.  Hematological: Negative for adenopathy. Does not bruise/bleed easily.  Psychiatric/Behavioral: Negative for suicidal ideas, confusion, sleep disturbance, self-injury, dysphoric mood, decreased concentration and agitation.       Objective:    BP 127/80 (BP Location: Left Arm, Patient Position: Sitting, Cuff Size: Normal)   Pulse 62   Temp 97.8 F (36.6 C) (Temporal)   Resp 18   Ht 5\' 5"  (1.651 m)   Wt 185 lb 3.2 oz (84 kg)   SpO2 98%   BMI 30.82 kg/m  General appearance: alert, cooperative, appears stated age and no distress Head: Normocephalic, without obvious abnormality, atraumatic Eyes: conjunctivae/corneas clear. PERRL, EOM's intact. Fundi benign. Ears: normal TM's and external ear canals both ears Nose: Nares normal. Septum  midline. Mucosa normal. No drainage or sinus tenderness. Throat: lips, mucosa, and tongue normal; teeth and gums normal Neck: no adenopathy, no carotid bruit, no JVD, supple, symmetrical, trachea midline and thyroid not enlarged, symmetric, no tenderness/mass/nodules Back: symmetric, no curvature. ROM normal. No CVA tenderness. Lungs: clear to auscultation bilaterally Breasts: normal appearance, no masses or tenderness Heart: regular rate and rhythm, S1, S2 normal, no murmur, click, rub or gallop Abdomen: soft, non-tender; bowel sounds normal; no masses,  no organomegaly Pelvic: not indicated; post-menopausal, no abnormal Pap smears in past Extremities: extremities normal, atraumatic, no cyanosis or edema Pulses: 2+ and symmetric Skin: Skin  color, texture, turgor normal. No rashes or lesions Lymph nodes: Cervical, supraclavicular, and axillary nodes normal. Neurologic: Alert and oriented X 3, normal strength and tone. Normal symmetric reflexes. Normal coordination and gait    Assessment:    Healthy female exam.      Plan:    ghm utd Pt will get shingrix at the pharmacy and flu shot next month See After Visit Summary for Counseling Recommendations    1. Hyperlipidemia, unspecified hyperlipidemia type Tolerating statin, encouraged heart healthy diet, avoid trans fats, minimize simple carbs and saturated fats. Increase exercise as tolerated - Lipid panel - Comprehensive metabolic panel  2. Hypothyroidism, unspecified type Check labs  con't meds  - TSH  3. Anxiety Stable Refill meds - escitalopram (LEXAPRO) 10 MG tablet; Take 1 tablet (10 mg total) by mouth at bedtime.  Dispense: 30 tablet; Refill: 3  4. Obesity (BMI 30.0-34.9)   - Amb Ref to Medical Weight Management  5. Fatigue, unspecified type Check labs  - B12 and Folate Panel - Vitamin D (25 hydroxy)

## 2019-06-05 NOTE — Patient Instructions (Signed)
Preventive Care 38 Years and Older, Female Preventive care refers to lifestyle choices and visits with your health care provider that can promote health and wellness. This includes:  A yearly physical exam. This is also called an annual well check.  Regular dental and eye exams.  Immunizations.  Screening for certain conditions.  Healthy lifestyle choices, such as diet and exercise. What can I expect for my preventive care visit? Physical exam Your health care provider will check:  Height and weight. These may be used to calculate body mass index (BMI), which is a measurement that tells if you are at a healthy weight.  Heart rate and blood pressure.  Your skin for abnormal spots. Counseling Your health care provider may ask you questions about:  Alcohol, tobacco, and drug use.  Emotional well-being.  Home and relationship well-being.  Sexual activity.  Eating habits.  History of falls.  Memory and ability to understand (cognition).  Work and work Statistician.  Pregnancy and menstrual history. What immunizations do I need?  Influenza (flu) vaccine  This is recommended every year. Tetanus, diphtheria, and pertussis (Tdap) vaccine  You may need a Td booster every 10 years. Varicella (chickenpox) vaccine  You may need this vaccine if you have not already been vaccinated. Zoster (shingles) vaccine  You may need this after age 33. Pneumococcal conjugate (PCV13) vaccine  One dose is recommended after age 33. Pneumococcal polysaccharide (PPSV23) vaccine  One dose is recommended after age 72. Measles, mumps, and rubella (MMR) vaccine  You may need at least one dose of MMR if you were born in 1957 or later. You may also need a second dose. Meningococcal conjugate (MenACWY) vaccine  You may need this if you have certain conditions. Hepatitis A vaccine  You may need this if you have certain conditions or if you travel or work in places where you may be exposed  to hepatitis A. Hepatitis B vaccine  You may need this if you have certain conditions or if you travel or work in places where you may be exposed to hepatitis B. Haemophilus influenzae type b (Hib) vaccine  You may need this if you have certain conditions. You may receive vaccines as individual doses or as more than one vaccine together in one shot (combination vaccines). Talk with your health care provider about the risks and benefits of combination vaccines. What tests do I need? Blood tests  Lipid and cholesterol levels. These may be checked every 5 years, or more frequently depending on your overall health.  Hepatitis C test.  Hepatitis B test. Screening  Lung cancer screening. You may have this screening every year starting at age 39 if you have a 30-pack-year history of smoking and currently smoke or have quit within the past 15 years.  Colorectal cancer screening. All adults should have this screening starting at age 36 and continuing until age 15. Your health care provider may recommend screening at age 23 if you are at increased risk. You will have tests every 1-10 years, depending on your results and the type of screening test.  Diabetes screening. This is done by checking your blood sugar (glucose) after you have not eaten for a while (fasting). You may have this done every 1-3 years.  Mammogram. This may be done every 1-2 years. Talk with your health care provider about how often you should have regular mammograms.  BRCA-related cancer screening. This may be done if you have a family history of breast, ovarian, tubal, or peritoneal cancers.  Other tests  Sexually transmitted disease (STD) testing.  Bone density scan. This is done to screen for osteoporosis. You may have this done starting at age 76. Follow these instructions at home: Eating and drinking  Eat a diet that includes fresh fruits and vegetables, whole grains, lean protein, and low-fat dairy products. Limit  your intake of foods with high amounts of sugar, saturated fats, and salt.  Take vitamin and mineral supplements as recommended by your health care provider.  Do not drink alcohol if your health care provider tells you not to drink.  If you drink alcohol: ? Limit how much you have to 0-1 drink a day. ? Be aware of how much alcohol is in your drink. In the [place] one drink equals one 12 oz bottle of beer (355 mL), one 5 oz glass of wine (148 mL), or one 1 oz glass of hard liquor (44 mL). Lifestyle  Take daily care of your teeth and gums.  Stay active. Exercise for at least 30 minutes on 5 or more days each week.  Do not use any products that contain nicotine or tobacco, such as cigarettes, e-cigarettes, and chewing tobacco. If you need help quitting, ask your health care provider.  If you are sexually active, practice safe sex. Use a condom or other form of protection in order to prevent STIs (sexually transmitted infections).  Talk with your health care provider about taking a low-dose aspirin or statin. What's next?  Go to your health care provider once a year for a well check visit.  Ask your health care provider how often you should have your eyes and teeth checked.  Stay up to date on all vaccines. This information is not intended to replace advice given to you by your health care provider. Make sure you discuss any questions you have with your health care provider. Document Released: 11/01/2015 Document Revised: 09/29/2018 Document Reviewed: 09/29/2018 Elsevier Patient Education  2020 [employer].

## 2019-06-07 ENCOUNTER — Telehealth: Payer: Self-pay

## 2019-06-07 ENCOUNTER — Other Ambulatory Visit: Payer: Self-pay | Admitting: Family Medicine

## 2019-06-07 DIAGNOSIS — E785 Hyperlipidemia, unspecified: Secondary | ICD-10-CM

## 2019-06-07 NOTE — Telephone Encounter (Signed)
That is correct--- stop the prozac

## 2019-06-07 NOTE — Telephone Encounter (Signed)
Pt wanted to confirm that she is not supposed to be taking the Prozac. Pt currently has Wellbutrin and Lexapro on her list. It appears Prozac was discontinued on 08/17 for change in therapy.

## 2019-06-08 NOTE — Telephone Encounter (Signed)
Pt.notified

## 2019-07-24 ENCOUNTER — Other Ambulatory Visit: Payer: Self-pay | Admitting: Family Medicine

## 2019-07-24 DIAGNOSIS — F321 Major depressive disorder, single episode, moderate: Secondary | ICD-10-CM

## 2019-07-24 MED ORDER — BUPROPION HCL ER (XL) 150 MG PO TB24: 150.0000 mg | ORAL_TABLET | Freq: Every day | ORAL | 2 refills | Status: DC

## 2019-07-24 NOTE — Telephone Encounter (Signed)
Medication Refill - Medication: buPROPion (WELLBUTRIN XL) 150 MG 24 hr tablet  Pt is completely out   Has the patient contacted their pharmacy? Yes.   (Agent: If no, request that the patient contact the pharmacy for the refill.) (Agent: If yes, when and what did the pharmacy advise?)no response from provider   Preferred Pharmacy (with phone number or street name):  [id number] - [place], [place] - [address] [phone] (Phone) [phone] (Fax)     Agent: Please be advised that RX refills may take up to 3 business days. We ask that you follow-up with your pharmacy.

## 2019-07-24 NOTE — Telephone Encounter (Signed)
Refill sent.

## 2019-09-26 ENCOUNTER — Telehealth: Payer: Self-pay | Admitting: *Deleted

## 2019-09-26 DIAGNOSIS — F419 Anxiety disorder, unspecified: Secondary | ICD-10-CM

## 2019-09-26 MED ORDER — ESCITALOPRAM OXALATE 10 MG PO TABS: 10.0000 mg | ORAL_TABLET | Freq: Every day | ORAL | 0 refills | Status: DC

## 2019-09-26 NOTE — Telephone Encounter (Signed)
Received request from [facility] for lexapro 10mg  daily. Refill sent.

## 2019-10-19 ENCOUNTER — Telehealth: Payer: Self-pay | Admitting: *Deleted

## 2019-10-19 DIAGNOSIS — F321 Major depressive disorder, single episode, moderate: Secondary | ICD-10-CM

## 2019-10-19 MED ORDER — BUPROPION HCL ER (XL) 150 MG PO TB24: 150.0000 mg | ORAL_TABLET | Freq: Every day | ORAL | 0 refills | Status: DC

## 2019-10-19 NOTE — Telephone Encounter (Signed)
Refills sent

## 2019-10-23 ENCOUNTER — Other Ambulatory Visit: Payer: Self-pay | Admitting: Family Medicine

## 2019-11-03 ENCOUNTER — Other Ambulatory Visit: Payer: Self-pay | Admitting: *Deleted

## 2019-11-03 DIAGNOSIS — F321 Major depressive disorder, single episode, moderate: Secondary | ICD-10-CM

## 2019-11-03 MED ORDER — BUPROPION HCL ER (XL) 150 MG PO TB24: 150.0000 mg | ORAL_TABLET | Freq: Every day | ORAL | 0 refills | Status: DC

## 2019-11-03 NOTE — Telephone Encounter (Signed)
Pt states pharmacy will not give her this medication because prior authorization hasn't bee done.  Pt states pharmacy has told her they have faxed Dr. Carollee Herter twice. Pt states that she only has one pill left. Pt can be reached at [phone]

## 2019-11-22 DIAGNOSIS — H524 Presbyopia: Secondary | ICD-10-CM | POA: Diagnosis not present

## 2019-11-22 DIAGNOSIS — H47333 Pseudopapilledema of optic disc, bilateral: Secondary | ICD-10-CM | POA: Diagnosis not present

## 2019-11-22 DIAGNOSIS — H43813 Vitreous degeneration, bilateral: Secondary | ICD-10-CM | POA: Diagnosis not present

## 2019-11-22 DIAGNOSIS — D3132 Benign neoplasm of left choroid: Secondary | ICD-10-CM | POA: Diagnosis not present

## 2019-11-22 DIAGNOSIS — H02834 Dermatochalasis of left upper eyelid: Secondary | ICD-10-CM | POA: Diagnosis not present

## 2019-11-22 DIAGNOSIS — H52223 Regular astigmatism, bilateral: Secondary | ICD-10-CM | POA: Diagnosis not present

## 2019-11-22 DIAGNOSIS — H02104 Unspecified ectropion of left upper eyelid: Secondary | ICD-10-CM | POA: Diagnosis not present

## 2019-11-22 DIAGNOSIS — H02831 Dermatochalasis of right upper eyelid: Secondary | ICD-10-CM | POA: Diagnosis not present

## 2019-11-22 DIAGNOSIS — H5213 Myopia, bilateral: Secondary | ICD-10-CM | POA: Diagnosis not present

## 2019-11-27 ENCOUNTER — Other Ambulatory Visit: Payer: Self-pay | Admitting: Family Medicine

## 2019-11-28 ENCOUNTER — Ambulatory Visit (INDEPENDENT_AMBULATORY_CARE_PROVIDER_SITE_OTHER): Payer: Self-pay | Admitting: Family Medicine

## 2019-12-07 ENCOUNTER — Ambulatory Visit: Payer: Medicare Other | Admitting: Family Medicine

## 2019-12-12 ENCOUNTER — Ambulatory Visit (INDEPENDENT_AMBULATORY_CARE_PROVIDER_SITE_OTHER): Payer: Self-pay | Admitting: Family Medicine

## 2019-12-25 ENCOUNTER — Other Ambulatory Visit: Payer: Self-pay | Admitting: *Deleted

## 2019-12-25 DIAGNOSIS — F419 Anxiety disorder, unspecified: Secondary | ICD-10-CM

## 2019-12-25 MED ORDER — ESCITALOPRAM OXALATE 10 MG PO TABS: 10.0000 mg | ORAL_TABLET | Freq: Every day | ORAL | 0 refills | Status: DC

## 2020-01-23 ENCOUNTER — Ambulatory Visit: Payer: Medicare PPO | Admitting: *Deleted

## 2020-01-23 ENCOUNTER — Ambulatory Visit: Payer: Medicare PPO | Admitting: Family Medicine

## 2020-02-06 ENCOUNTER — Other Ambulatory Visit: Payer: Self-pay

## 2020-02-06 ENCOUNTER — Encounter: Payer: Self-pay | Admitting: Family Medicine

## 2020-02-06 ENCOUNTER — Ambulatory Visit: Payer: Medicare PPO | Admitting: Family Medicine

## 2020-02-06 VITALS — BP 118/80 | HR 67 | Temp 97.9°F | Resp 18 | Ht 65.0 in | Wt 181.8 lb

## 2020-02-06 DIAGNOSIS — F321 Major depressive disorder, single episode, moderate: Secondary | ICD-10-CM

## 2020-02-06 DIAGNOSIS — E785 Hyperlipidemia, unspecified: Secondary | ICD-10-CM | POA: Diagnosis not present

## 2020-02-06 DIAGNOSIS — R05 Cough: Secondary | ICD-10-CM

## 2020-02-06 DIAGNOSIS — R0982 Postnasal drip: Secondary | ICD-10-CM

## 2020-02-06 DIAGNOSIS — E039 Hypothyroidism, unspecified: Secondary | ICD-10-CM | POA: Diagnosis not present

## 2020-02-06 DIAGNOSIS — R059 Cough, unspecified: Secondary | ICD-10-CM | POA: Insufficient documentation

## 2020-02-06 LAB — LIPID PANEL
Cholesterol: 207 mg/dL — ABNORMAL HIGH (ref 0–200)
HDL: 63.9 mg/dL (ref >39.00)
LDL Cholesterol: 125 mg/dL — ABNORMAL HIGH (ref 0–99)
NonHDL: 143.43
Total CHOL/HDL Ratio: 3
Triglycerides: 90 mg/dL (ref 0.0–149.0)
VLDL: 18 mg/dL (ref 0.0–40.0)

## 2020-02-06 LAB — COMPREHENSIVE METABOLIC PANEL
ALT: 12 U/L (ref 0–35)
AST: 18 U/L (ref 0–37)
Albumin: 4.3 g/dL (ref 3.5–5.2)
Alkaline Phosphatase: 78 U/L (ref 39–117)
BUN: 8 mg/dL (ref 6–23)
CO2: 31 mEq/L (ref 19–32)
Calcium: 9.8 mg/dL (ref 8.4–10.5)
Chloride: 101 mEq/L (ref 96–112)
Creatinine, Ser: 0.77 mg/dL (ref 0.40–1.20)
GFR: 73.59 mL/min (ref >60.00)
Glucose, Bld: 81 mg/dL (ref 70–99)
Potassium: 4.2 mEq/L (ref 3.5–5.1)
Sodium: 137 mEq/L (ref 135–145)
Total Bilirubin: 0.8 mg/dL (ref 0.2–1.2)
Total Protein: 6.5 g/dL (ref 6.0–8.3)

## 2020-02-06 LAB — TSH: TSH: 0.89 u[IU]/mL (ref 0.35–4.50)

## 2020-02-06 MED ORDER — CETIRIZINE HCL 10 MG PO TABS: 10.0000 mg | ORAL_TABLET | Freq: Every day | ORAL | 11 refills | Status: AC

## 2020-02-06 MED ORDER — FLUTICASONE PROPIONATE 50 MCG/ACT NA SUSP: 2.0000 | Freq: Every day | NASAL | 6 refills | Status: AC

## 2020-02-06 NOTE — Patient Instructions (Signed)
Allergies, Adult An allergy is when your body's defense system (immune system) overreacts to an otherwise harmless substance (allergen) that you breathe in or eat or something that touches your skin. When you come into contact with something that you are allergic to, your immune system produces certain proteins (antibodies). These proteins cause cells to release chemicals (histamines) that trigger the symptoms of an allergic reaction. Allergies often affect the nasal passages (allergic rhinitis), eyes (allergic conjunctivitis), skin (atopic dermatitis), and stomach. Allergies can be mild or severe. Allergies cannot spread from person to person (are not contagious). They can develop at any age and may be outgrown. What increases the risk? You may be at greater risk of allergies if other people in your family have allergies. What are the signs or symptoms? Symptoms depend on what type of allergy you have. They may include:  Runny, stuffy nose.  Sneezing.  Itchy mouth, ears, or throat.  Postnasal drip.  Sore throat.  Itchy, red, watery, or puffy eyes.  Skin rash or hives.  Stomach pain.  Vomiting.  Diarrhea.  Bloating.  Wheezing or coughing. People with a severe allergy to food, medicine, or an insect bite may have a life-threatening allergic reaction (anaphylaxis). Symptoms of anaphylaxis include:  Hives.  Itching.  Flushed face.  Swollen lips, tongue, or mouth.  Tight or swollen throat.  Chest pain or tightness in the chest.  Trouble breathing or shortness of breath.  Rapid heartbeat.  Dizziness or fainting.  Vomiting.  Diarrhea.  Pain in the abdomen. How is this diagnosed? This condition is diagnosed based on:  Your symptoms.  Your family and medical history.  A physical exam. You may need to see a health care provider who specializes in treating allergies (allergist). You may also have tests, including:  Skin tests to see which allergens are causing  your symptoms, such as: ? Skin prick test. In this test, your skin is pricked with a tiny needle and exposed to small amounts of possible allergens to see if your skin reacts. ? Intradermal skin test. In this test, a small amount of allergen is injected under your skin to see if your skin reacts. ? Patch test. In this test, a small amount of allergen is placed on your skin and then your skin is covered with a bandage. Your health care provider will check your skin after a couple of days to see if a rash has developed.  Blood tests.  Challenges tests. In this test, you inhale a small amount of allergen by mouth to see if you have an allergic reaction. You may also be asked to:  Keep a food diary. A food diary is a record of all the foods and drinks you have in a day and any symptoms you experience.  Practice an elimination diet. An elimination diet involves eliminating specific foods from your diet and then adding them back in one by one to find out if a certain food causes an allergic reaction. How is this treated? Treatment for allergies depends on your symptoms. Treatment may include:  Cold compresses to soothe itching and swelling.  Eye drops.  Nasal sprays.  Using a saline spray or container (neti pot) to flush out the nose (nasal irrigation). These methods can help clear away mucus and keep the nasal passages moist.  Using a humidifier.  Oral antihistamines or other medicines to block allergic reaction and inflammation.  Skin creams to treat rashes or itching.  Diet changes to eliminate food allergy triggers.  Repeated exposure to tiny amounts of allergens to build up a tolerance and prevent future allergic reactions (immunotherapy). These include: °? Allergy shots. °? Oral treatment. This involves taking small doses of an allergen under the tongue (sublingual immunotherapy). °· Emergency epinephrine injection (auto-injector) in case of an allergic emergency. This is a  self-injectable, pre-measured medicine that must be given within the first few minutes of a serious allergic reaction. °Follow these instructions at home: ° °  ° °  ° °· Avoid known allergens whenever possible. °· If you suffer from airborne allergens, wash out your nose daily. You can do this with a saline spray or a neti pot to flush out your nose (nasal irrigation). °· Take over-the-counter and prescription medicines only as told by your health care provider. °· Keep all follow-up visits as told by your health care provider. This is important. °· If you are at risk of a severe allergic reaction (anaphylaxis), keep your auto-injector with you at all times. °· If you have ever had anaphylaxis, wear a medical alert bracelet or necklace that states you have a severe allergy. °Contact a health care provider if: °· Your symptoms do not improve with treatment. °Get help right away if: °· You have symptoms of anaphylaxis, such as: °? Swollen mouth, tongue, or throat. °? Pain or tightness in your chest. °? Trouble breathing or shortness of breath. °? Dizziness or fainting. °? Severe abdominal pain, vomiting, or diarrhea. °This information is not intended to replace advice given to you by your health care provider. Make sure you discuss any questions you have with your health care provider. °Document Revised: 12/29/2017 Document Reviewed: 04/22/2016 °Elsevier Patient Education © 2020 [employer] ° °

## 2020-02-06 NOTE — Assessment & Plan Note (Signed)
Check labs con't synthroid 

## 2020-02-06 NOTE — Assessment & Plan Note (Signed)
Stable con't meds 

## 2020-02-06 NOTE — Progress Notes (Signed)
Patient ID: Denise Lowery, female    DOB: 06-17-1946  Age: 74 y.o. MRN: [id number]    Subjective:  Subjective  HPI Denise Lowery presents for f/u dry cough and thyroid  Cough x over a year.    cxr neg and covid neg a year ago No fever  Cough is usually at night only She is taking zyrtec and nexium regularly  Pt does  Have PND  Review of Systems  Constitutional: Negative for appetite change, diaphoresis, fatigue and unexpected weight change.  HENT: Positive for postnasal drip. Negative for congestion, ear pain, facial swelling, hearing loss, mouth sores, nosebleeds, rhinorrhea, sinus pressure, sinus pain, sneezing, sore throat, tinnitus, trouble swallowing and voice change.   Eyes: Negative for pain, redness and visual disturbance.  Respiratory: Positive for cough. Negative for chest tightness, shortness of breath and wheezing.   Cardiovascular: Negative for chest pain, palpitations and leg swelling.  Endocrine: Negative for cold intolerance, heat intolerance, polydipsia, polyphagia and polyuria.  Genitourinary: Negative for difficulty urinating, dysuria and frequency.  Neurological: Negative for dizziness, light-headedness, numbness and headaches.    History Past Medical History:  Diagnosis Date  . Back pain   . Bronchitis   . Depression   . Environmental allergies   . GERD (gastroesophageal reflux disease)   . Scoliosis   . Thyroid disease     She has a past surgical history that includes Tonsillectomy (1998); Appendectomy (1972); Knee surgery; and Cataract extraction, bilateral (October and December 2017).   Her family history includes Heart disease (age of onset: 46) in her brother; Heart disease (age of onset: 6) in her cousin; Stroke (age of onset: 41) in her father.She reports that she has never smoked. She has never used smokeless tobacco. She reports current alcohol use of about 5.0 standard drinks of alcohol per week. She reports that she does not use drugs.  Current  Outpatient Medications on File Prior to Visit  Medication Sig Dispense Refill  . albuterol (PROAIR HFA) 108 (90 Base) MCG/ACT inhaler ProAir HFA 90 mcg/actuation aerosol inhaler  INL 1 TO 2 PFS PO Q 4 TO 6 H PRN    . buPROPion (WELLBUTRIN XL) 150 MG 24 hr tablet Take 1 tablet (150 mg total) by mouth daily. 90 tablet 0  . Calcium Carbonate Antacid (TUMS PO) Take 1 tablet by mouth as needed.    Marland Kitchen escitalopram (LEXAPRO) 10 MG tablet Take 1 tablet (10 mg total) by mouth at bedtime. NEEDS OV/FOLLOW UP 90 tablet 0  . esomeprazole (NEXIUM) 40 MG capsule TAKE 1 CAPSULE(40 MG) BY MOUTH DAILY 90 capsule 1  . levothyroxine (SYNTHROID) 75 MCG tablet TAKE 1 TABLET BY MOUTH DAILY 90 tablet 0   Current Facility-Administered Medications on File Prior to Visit  Medication Dose Route Frequency Provider Last Rate Last Admin  . 0.9 %  sodium chloride infusion  500 mL Intravenous Once Ladene Artist, MD         Objective:  Objective  Physical Exam Vitals and nursing note reviewed.  Constitutional:      Appearance: She is well-developed.  HENT:     Head: Normocephalic and atraumatic.  Eyes:     Conjunctiva/sclera: Conjunctivae normal.  Neck:     Thyroid: No thyroid mass, thyromegaly or thyroid tenderness.     Vascular: No carotid bruit or JVD.  Cardiovascular:     Rate and Rhythm: Normal rate and regular rhythm.     Heart sounds: Normal heart sounds. No murmur.  Pulmonary:  Effort: Pulmonary effort is normal. No respiratory distress.     Breath sounds: Normal breath sounds. No wheezing or rales.  Chest:     Chest wall: No tenderness.  Musculoskeletal:     Cervical back: Normal range of motion and neck supple.  Neurological:     Mental Status: She is alert and oriented to person, place, and time.    BP 118/80 (BP Location: Right Arm, Patient Position: Sitting, Cuff Size: Normal)   Pulse 67   Temp 97.9 F (36.6 C) (Temporal)   Resp 18   Ht 5\' 5"  (1.651 m)   Wt 181 lb 12.8 oz (82.5 kg)    SpO2 95%   BMI 30.25 kg/m  Wt Readings from Last 3 Encounters:  02/06/20 181 lb 12.8 oz (82.5 kg)  06/05/19 185 lb 3.2 oz (84 kg)  02/17/19 188 lb 12.8 oz (85.6 kg)     Lab Results  Component Value Date   WBC 5.8 06/08/2017   HGB 12.9 06/08/2017   HCT 38.7 06/08/2017   PLT 289.0 06/08/2017   GLUCOSE 83 06/05/2019   CHOL 231 (H) 06/05/2019   TRIG 192.0 (H) 06/05/2019   HDL 61.30 06/05/2019   LDLCALC 131 (H) 06/05/2019   ALT 10 06/05/2019   AST 15 06/05/2019   NA 132 (L) 06/05/2019   K 4.9 06/05/2019   CL 97 06/05/2019   CREATININE 0.74 06/05/2019   BUN 19 06/05/2019   CO2 28 06/05/2019   TSH 1.64 06/05/2019    MM 3D SCREEN BREAST BILATERAL  Result Date: 06/06/2019 CLINICAL DATA:  Screening. EXAM: DIGITAL SCREENING BILATERAL MAMMOGRAM WITH TOMO AND CAD COMPARISON:  Previous exam(s). ACR Breast Density Category b: There are scattered areas of fibroglandular density. FINDINGS: There are no findings suspicious for malignancy. Images were processed with CAD. IMPRESSION: No mammographic evidence of malignancy. A result letter of this screening mammogram will be mailed directly to the patient. RECOMMENDATION: Screening mammogram in one year. (Code:SM-B-01Y) BI-RADS CATEGORY  1: Negative. Electronically Signed   By: Curlene Dolphin M.D.   On: 06/06/2019 10:20     Assessment & Plan:  Plan  I am having Denise Lowery start on cetirizine and fluticasone. I am also having her maintain her Calcium Carbonate Antacid (TUMS PO), albuterol, esomeprazole, levothyroxine, buPROPion, and escitalopram. We will continue to administer sodium chloride.  Meds ordered this encounter  Medications  . cetirizine (ZYRTEC) 10 MG tablet    Sig: Take 1 tablet (10 mg total) by mouth daily.    Dispense:  30 tablet    Refill:  11  . fluticasone (FLONASE) 50 MCG/ACT nasal spray    Sig: Place 2 sprays into both nostrils daily.    Dispense:  16 g    Refill:  6    Problem List Items Addressed This Visit       Unprioritized   Cough    con't antihistamine and start flonase con't nexium If no better consider pulm referral      Depression, major, single episode, moderate (HCC)    Stable con't meds      Hypothyroidism   Relevant Orders   TSH   PND (post-nasal drip) - Primary    Cont zyrtec and start flonase      Relevant Medications   cetirizine (ZYRTEC) 10 MG tablet   fluticasone (FLONASE) 50 MCG/ACT nasal spray    Other Visit Diagnoses    Hyperlipidemia, unspecified hyperlipidemia type       Relevant Orders   Lipid panel  Comprehensive metabolic panel      Follow-up: Return in about 6 months (around 08/07/2020), or if symptoms worsen or fail to improve, for annual exam, fasting.  Ann Held, DO

## 2020-02-06 NOTE — Assessment & Plan Note (Signed)
con't antihistamine and start flonase con't nexium If no better consider pulm referral

## 2020-02-06 NOTE — Assessment & Plan Note (Signed)
Cont zyrtec and start flonase

## 2020-02-08 ENCOUNTER — Ambulatory Visit: Payer: Medicare PPO | Admitting: *Deleted

## 2020-02-08 ENCOUNTER — Ambulatory Visit: Payer: Medicare PPO | Admitting: Family Medicine

## 2020-02-09 ENCOUNTER — Ambulatory Visit: Payer: Medicare PPO | Admitting: *Deleted

## 2020-02-09 NOTE — Progress Notes (Signed)
Nurse connected with patient 02/12/20 at 11:00 AM EDT by a telephone enabled telemedicine application and verified that I am speaking with the correct person using two identifiers. Patient stated full name and DOB. Patient gave permission to continue with virtual visit. Patient's location was at home and Nurse's location was at [place] office.   Subjective:   Denise Lowery is a 74 y.o. female who presents for Medicare Annual (Subsequent) preventive examination.  Still substitute teaches part to full time. She loves it!  Review of Systems:  Home Safety/Smoke Alarms: Feels safe in home. Smoke alarms in place.  Lives alone with 2 cats in 1 story home.    Female:     Mammo- 06/06/19      Dexa scan-  03/25/17. Declines today due to pandemic.      CCS- Cologuard 04/09/17. Negative      Objective:     Vitals: Unable to assess. This visit is enabled though telemedicine due to Covid 19.   Advanced Directives 02/12/2020 04/19/2018 03/23/2017 02/24/2016  Does Patient Have a Medical Advance Directive? Yes Yes No Yes  Type of Paramedic of [place];Living will [place];Living will - [place];Living will  Does patient want to make changes to medical advance directive? No - Patient declined No - Patient declined No - Patient declined No - Patient declined  Copy of [place] in Chart? No - copy requested No - copy requested - No - copy requested    Tobacco Social History   Tobacco Use  Smoking Status Never Smoker  Smokeless Tobacco Never Used     Counseling given: Not Answered   Clinical Intake: Pain : No/denies pain     Past Medical History:  Diagnosis Date  . Back pain   . Bronchitis   . Depression   . Environmental allergies   . GERD (gastroesophageal reflux disease)   . Scoliosis   . Thyroid disease    Past Surgical History:  Procedure Laterality Date  . APPENDECTOMY  1972  . CATARACT EXTRACTION,  BILATERAL  October and December 2017   Dr. Antionette Fairy  . KNEE SURGERY     RIGHT  . TONSILLECTOMY  1998   Family History  Problem Relation Age of Onset  . Stroke Father 51  . Heart disease Brother 5       MI  . Heart disease Cousin 74       MI   Social History   Socioeconomic History  . Marital status: Single    Spouse name: Not on file  . Number of children: Not on file  . Years of education: Not on file  . Highest education level: Not on file  Occupational History  . Occupation: Editor, commissioning , [employer]  Tobacco Use  . Smoking status: Never Smoker  . Smokeless tobacco: Never Used  Substance and Sexual Activity  . Alcohol use: Yes    Alcohol/week: 5.0 standard drinks    Types: 5 Glasses of wine per week    Comment: Socially  . Drug use: No  . Sexual activity: Not Currently    Partners: Male  Other Topics Concern  . Not on file  Social History Narrative   Exercise-- 3x a week,  Walking, weights, ellipitical, bike       Social Determinants of Health   Financial Resource Strain: Low Risk   . Difficulty of Paying Living Expenses: Not hard at all  Food Insecurity: No Food Insecurity  .  Worried About Charity fundraiser in the Last Year: Never true  . Ran Out of Food in the Last Year: Never true  Transportation Needs: No Transportation Needs  . Lack of Transportation (Medical): No  . Lack of Transportation (Non-Medical): No  Physical Activity:   . Days of Exercise per Week:   . Minutes of Exercise per Session:   Stress:   . Feeling of Stress :   Social Connections:   . Frequency of Communication with Friends and Family:   . Frequency of Social Gatherings with Friends and Family:   . Attends Religious Services:   . Active Member of Clubs or Organizations:   . Attends Archivist Meetings:   Marland Kitchen Marital Status:     Outpatient Encounter Medications as of 02/12/2020  Medication Sig  . buPROPion (WELLBUTRIN XL) 150 MG 24 hr tablet Take 1 tablet (150 mg total)  by mouth daily.  . Calcium Carbonate Antacid (TUMS PO) Take 1 tablet by mouth as needed.  . cetirizine (ZYRTEC) 10 MG tablet Take 1 tablet (10 mg total) by mouth daily.  Marland Kitchen escitalopram (LEXAPRO) 10 MG tablet Take 1 tablet (10 mg total) by mouth at bedtime. NEEDS OV/FOLLOW UP  . esomeprazole (NEXIUM) 40 MG capsule TAKE 1 CAPSULE(40 MG) BY MOUTH DAILY  . fluticasone (FLONASE) 50 MCG/ACT nasal spray Place 2 sprays into both nostrils daily.  Marland Kitchen levothyroxine (SYNTHROID) 75 MCG tablet TAKE 1 TABLET BY MOUTH DAILY  . albuterol (PROAIR HFA) 108 (90 Base) MCG/ACT inhaler ProAir HFA 90 mcg/actuation aerosol inhaler  INL 1 TO 2 PFS PO Q 4 TO 6 H PRN   Facility-Administered Encounter Medications as of 02/12/2020  Medication  . 0.9 %  sodium chloride infusion    Activities of Daily Living In your present state of health, do you have any difficulty performing the following activities: 02/12/2020 06/05/2019  Hearing? N N  Vision? N N  Difficulty concentrating or making decisions? N N  Walking or climbing stairs? N N  Dressing or bathing? N N  Doing errands, shopping? N N  Preparing Food and eating ? N -  Using the Toilet? N -  In the past six months, have you accidently leaked urine? N -  Do you have problems with loss of bowel control? N -  Managing your Medications? N -  Managing your Finances? N -  Housekeeping or managing your Housekeeping? N -  Some recent data might be hidden    Patient Care Team: Carollee Herter, Alferd Apa, DO as PCP - General (Family Medicine) Shirl Harris, [place] as Consulting Physician (Optometry) Alycia Rossetti, DC as Consulting Physician (Chiropractic Medicine) Clide Cliff, DDS as Consulting Physician (Dentistry) Antionette Fairy, Isaias Cowman, MD as Consulting Physician (Ophthalmology)    Assessment:   This is a routine wellness examination for [place]. Physical assessment deferred to PCP.   Exercise Activities and Dietary recommendations Current Exercise Habits: Home exercise  routine, Type of exercise: walking, Time (Minutes): 20, Frequency (Times/Week): 3, Weekly Exercise (Minutes/Week): 60, Intensity: Mild, Exercise limited by: None identified Diet (meal preparation, eat out, water intake, caffeinated beverages, dairy products, fruits and vegetables): in general, a "healthy" diet  , well balanced      Goals    . Have 3 meals a day       Fall Risk Fall Risk  02/12/2020 04/19/2018 03/23/2017 02/24/2016 02/24/2016  Falls in the past year? 0 Yes No No No  Number falls in past yr: 0 1 - - -  Injury with Fall? 0 No - - -  Follow up Education provided;Falls prevention discussed Education provided;Falls prevention discussed - - -  Comment - - - - -    Depression Screen PHQ 2/9 Scores 02/12/2020 06/05/2019 02/17/2019 04/19/2018  PHQ - 2 Score 0 4 2 1   PHQ- 9 Score - 15 7 -     Cognitive Function Ad8 score reviewed for issues:  Issues making decisions:no  Less interest in hobbies / activities:no  Repeats questions, stories (family complaining):no  Trouble using ordinary gadgets (microwave, computer, phone):no  Forgets the month or year: no  Mismanaging finances: no  Remembering appts:no  Daily problems with thinking and/or memory:no Ad8 score is=0     MMSE - Mini Mental State Exam 04/19/2018 03/23/2017  Orientation to time 5 5  Orientation to Place 5 5  Registration 3 3  Attention/ Calculation 5 5  Recall 1 3  Language- name 2 objects 2 2  Language- repeat 1 1  Language- follow 3 step command 3 3  Language- read & follow direction 1 1  Write a sentence 1 1  Copy design 1 0  Total score 28 29        Immunization History  Administered Date(s) Administered  . Influenza-Unspecified 08/19/2014  . Pneumococcal Conjugate-13 02/24/2016  . Pneumococcal Polysaccharide-23 12/25/2013  . Tdap 10/20/1999, 07/26/2012    Screening Tests Health Maintenance  Topic Date Due  . COVID-19 Vaccine (1) Never done  . Hepatitis C Screening  03/29/2024 (Originally  1945/12/15)  . Fecal DNA (Cologuard)  04/09/2020  . INFLUENZA VACCINE  05/19/2020  . MAMMOGRAM  06/04/2020  . TETANUS/TDAP  07/26/2022  . DEXA SCAN  Completed  . PNA vac Low Risk Adult  Completed      Plan:    Please schedule your next medicare wellness visit with me in 1 yr.  Continue to eat heart healthy diet (full of fruits, vegetables, whole grains, lean protein, water--limit salt, fat, and sugar intake) and increase physical activity as tolerated.  Continue doing brain stimulating activities (puzzles, reading, adult coloring books, staying active) to keep memory sharp.     I have personally reviewed and noted the following in the patient's chart:   . Medical and social history . Use of alcohol, tobacco or illicit drugs  . Current medications and supplements . Functional ability and status . Nutritional status . Physical activity . Advanced directives . List of other physicians . Hospitalizations, surgeries, and ER visits in previous 12 months . Vitals . Screenings to include cognitive, depression, and falls . Referrals and appointments  In addition, I have reviewed and discussed with patient certain preventive protocols, quality metrics, and best practice recommendations. A written personalized care plan for preventive services as well as general preventive health recommendations were provided to patient.     Naaman Plummer [place], [place]  02/12/2020

## 2020-02-12 ENCOUNTER — Ambulatory Visit (INDEPENDENT_AMBULATORY_CARE_PROVIDER_SITE_OTHER): Payer: Medicare PPO | Admitting: *Deleted

## 2020-02-12 ENCOUNTER — Other Ambulatory Visit: Payer: Self-pay

## 2020-02-12 ENCOUNTER — Encounter: Payer: Self-pay | Admitting: *Deleted

## 2020-02-12 DIAGNOSIS — Z0000 Encounter for general adult medical examination without abnormal findings: Secondary | ICD-10-CM

## 2020-02-12 NOTE — Patient Instructions (Signed)
Please schedule your next medicare wellness visit with me in 1 yr.  Continue to eat heart healthy diet (full of fruits, vegetables, whole grains, lean protein, water--limit salt, fat, and sugar intake) and increase physical activity as tolerated.  Continue doing brain stimulating activities (puzzles, reading, adult coloring books, staying active) to keep memory sharp.    Denise Lowery , Thank you for taking time to come for your Medicare Wellness Visit. I appreciate your ongoing commitment to your health goals. Please review the following plan we discussed and let me know if I can assist you in the future.   These are the goals we discussed: Goals    . Have 3 meals a day       This is a list of the screening recommended for you and due dates:  Health Maintenance  Topic Date Due  . COVID-19 Vaccine (1) Never done  .  Hepatitis C: One time screening is recommended by Center for Disease Control  (CDC) for  adults born from [birth year] through 1964.   03/29/2024*  . Cologuard (Stool DNA test)  04/09/2020  . Flu Shot  05/19/2020  . Mammogram  06/04/2020  . Tetanus Vaccine  07/26/2022  . DEXA scan (bone density measurement)  Completed  . Pneumonia vaccines  Completed  *Topic was postponed. The date shown is not the original due date.    Preventive Care 27 Years and Older, Female Preventive care refers to lifestyle choices and visits with your health care provider that can promote health and wellness. This includes:  A yearly physical exam. This is also called an annual well check.  Regular dental and eye exams.  Immunizations.  Screening for certain conditions.  Healthy lifestyle choices, such as diet and exercise. What can I expect for my preventive care visit? Physical exam Your health care provider will check:  Height and weight. These may be used to calculate body mass index (BMI), which is a measurement that tells if you are at a healthy weight.  Heart rate and blood  pressure.  Your skin for abnormal spots. Counseling Your health care provider may ask you questions about:  Alcohol, tobacco, and drug use.  Emotional well-being.  Home and relationship well-being.  Sexual activity.  Eating habits.  History of falls.  Memory and ability to understand (cognition).  Work and work Statistician.  Pregnancy and menstrual history. What immunizations do I need?  Influenza (flu) vaccine  This is recommended every year. Tetanus, diphtheria, and pertussis (Tdap) vaccine  You may need a Td booster every 10 years. Varicella (chickenpox) vaccine  You may need this vaccine if you have not already been vaccinated. Zoster (shingles) vaccine  You may need this after age 84. Pneumococcal conjugate (PCV13) vaccine  One dose is recommended after age 26. Pneumococcal polysaccharide (PPSV23) vaccine  One dose is recommended after age 73. Measles, mumps, and rubella (MMR) vaccine  You may need at least one dose of MMR if you were born in 1957 or later. You may also need a second dose. Meningococcal conjugate (MenACWY) vaccine  You may need this if you have certain conditions. Hepatitis A vaccine  You may need this if you have certain conditions or if you travel or work in places where you may be exposed to hepatitis A. Hepatitis B vaccine  You may need this if you have certain conditions or if you travel or work in places where you may be exposed to hepatitis B. Haemophilus influenzae type b (Hib) vaccine  You may need this if you have certain conditions. You may receive vaccines as individual doses or as more than one vaccine together in one shot (combination vaccines). Talk with your health care provider about the risks and benefits of combination vaccines. What tests do I need? Blood tests  Lipid and cholesterol levels. These may be checked every 5 years, or more frequently depending on your overall health.  Hepatitis C test.  Hepatitis B  test. Screening  Lung cancer screening. You may have this screening every year starting at age 55 if you have a 30-pack-year history of smoking and currently smoke or have quit within the past 15 years.  Colorectal cancer screening. All adults should have this screening starting at age 50 and continuing until age 75. Your health care provider may recommend screening at age 45 if you are at increased risk. You will have tests every 1-10 years, depending on your results and the type of screening test.  Diabetes screening. This is done by checking your blood sugar (glucose) after you have not eaten for a while (fasting). You may have this done every 1-3 years.  Mammogram. This may be done every 1-2 years. Talk with your health care provider about how often you should have regular mammograms.  BRCA-related cancer screening. This may be done if you have a family history of breast, ovarian, tubal, or peritoneal cancers. Other tests  Sexually transmitted disease (STD) testing.  Bone density scan. This is done to screen for osteoporosis. You may have this done starting at age 65. Follow these instructions at home: Eating and drinking  Eat a diet that includes fresh fruits and vegetables, whole grains, lean protein, and low-fat dairy products. Limit your intake of foods with high amounts of sugar, saturated fats, and salt.  Take vitamin and mineral supplements as recommended by your health care provider.  Do not drink alcohol if your health care provider tells you not to drink.  If you drink alcohol: ? Limit how much you have to 0-1 drink a day. ? Be aware of how much alcohol is in your drink. In the [place] one drink equals one 12 oz bottle of beer (355 mL), one 5 oz glass of wine (148 mL), or one 1 oz glass of hard liquor (44 mL). Lifestyle  Take daily care of your teeth and gums.  Stay active. Exercise for at least 30 minutes on 5 or more days each week.  Do not use any products that  contain nicotine or tobacco, such as cigarettes, e-cigarettes, and chewing tobacco. If you need help quitting, ask your health care provider.  If you are sexually active, practice safe sex. Use a condom or other form of protection in order to prevent STIs (sexually transmitted infections).  Talk with your health care provider about taking a low-dose aspirin or statin. What's next?  Go to your health care provider once a year for a well check visit.  Ask your health care provider how often you should have your eyes and teeth checked.  Stay up to date on all vaccines. This information is not intended to replace advice given to you by your health care provider. Make sure you discuss any questions you have with your health care provider. Document Revised: 09/29/2018 Document Reviewed: 09/29/2018 Elsevier Patient Education  2020 Elsevier Inc.  

## 2020-02-29 ENCOUNTER — Other Ambulatory Visit: Payer: Self-pay | Admitting: Family Medicine

## 2020-03-01 DIAGNOSIS — H02834 Dermatochalasis of left upper eyelid: Secondary | ICD-10-CM | POA: Diagnosis not present

## 2020-03-01 DIAGNOSIS — H02831 Dermatochalasis of right upper eyelid: Secondary | ICD-10-CM | POA: Diagnosis not present

## 2020-03-11 DIAGNOSIS — H02831 Dermatochalasis of right upper eyelid: Secondary | ICD-10-CM | POA: Diagnosis not present

## 2020-03-11 DIAGNOSIS — H02834 Dermatochalasis of left upper eyelid: Secondary | ICD-10-CM | POA: Diagnosis not present

## 2020-03-27 ENCOUNTER — Other Ambulatory Visit: Payer: Self-pay

## 2020-03-27 DIAGNOSIS — F419 Anxiety disorder, unspecified: Secondary | ICD-10-CM

## 2020-03-27 MED ORDER — ESCITALOPRAM OXALATE 10 MG PO TABS: 10.0000 mg | ORAL_TABLET | Freq: Every day | ORAL | 0 refills | Status: DC

## 2020-04-02 DIAGNOSIS — M2011 Hallux valgus (acquired), right foot: Secondary | ICD-10-CM | POA: Diagnosis not present

## 2020-04-02 DIAGNOSIS — M7122 Synovial cyst of popliteal space [Baker], left knee: Secondary | ICD-10-CM | POA: Diagnosis not present

## 2020-04-02 DIAGNOSIS — M21612 Bunion of left foot: Secondary | ICD-10-CM | POA: Diagnosis not present

## 2020-04-02 DIAGNOSIS — M2010 Hallux valgus (acquired), unspecified foot: Secondary | ICD-10-CM | POA: Insufficient documentation

## 2020-04-02 DIAGNOSIS — M7989 Other specified soft tissue disorders: Secondary | ICD-10-CM | POA: Diagnosis not present

## 2020-04-02 DIAGNOSIS — M21611 Bunion of right foot: Secondary | ICD-10-CM | POA: Diagnosis not present

## 2020-04-02 DIAGNOSIS — M2012 Hallux valgus (acquired), left foot: Secondary | ICD-10-CM | POA: Diagnosis not present

## 2020-04-16 DIAGNOSIS — M25562 Pain in left knee: Secondary | ICD-10-CM | POA: Diagnosis not present

## 2020-04-16 DIAGNOSIS — M4186 Other forms of scoliosis, lumbar region: Secondary | ICD-10-CM | POA: Diagnosis not present

## 2020-04-16 DIAGNOSIS — M5136 Other intervertebral disc degeneration, lumbar region: Secondary | ICD-10-CM | POA: Diagnosis not present

## 2020-04-16 DIAGNOSIS — M4316 Spondylolisthesis, lumbar region: Secondary | ICD-10-CM | POA: Diagnosis not present

## 2020-04-17 DIAGNOSIS — M4316 Spondylolisthesis, lumbar region: Secondary | ICD-10-CM | POA: Insufficient documentation

## 2020-04-18 ENCOUNTER — Telehealth: Payer: Self-pay | Admitting: Family Medicine

## 2020-04-18 DIAGNOSIS — F321 Major depressive disorder, single episode, moderate: Secondary | ICD-10-CM

## 2020-04-18 MED ORDER — BUPROPION HCL ER (XL) 150 MG PO TB24: 150.0000 mg | ORAL_TABLET | Freq: Every day | ORAL | 0 refills | Status: DC

## 2020-04-18 NOTE — Telephone Encounter (Signed)
Refill sent.

## 2020-04-18 NOTE — Telephone Encounter (Signed)
Medication:buPROPion (WELLBUTRIN XL) 150 MG 24 hr tablet  Patient needs refill today she's traveling at 5am out of town    Has the patient contacted their pharmacy? Yes.   (If no, request that the patient contact the pharmacy for the refill.) (If yes, when and what did the pharmacy advise?)  Preferred Pharmacy (with phone number or street name):   [id number] - [place], [place] - [address]  [address] [place], [place]  [address]  Phone:  [phone] Fax:  [phone]  Agent: Please be advised that RX refills may take up to 3 business days. We ask that you follow-up with your pharmacy.

## 2020-06-04 ENCOUNTER — Other Ambulatory Visit: Payer: Self-pay | Admitting: Family Medicine

## 2020-06-04 ENCOUNTER — Telehealth: Payer: Self-pay | Admitting: Family Medicine

## 2020-06-04 NOTE — Telephone Encounter (Signed)
Pt would like Dr. Etter Sjogren to order her the cologuard test.

## 2020-06-05 NOTE — Telephone Encounter (Signed)
Cologuard ordered for patient.  

## 2020-06-05 NOTE — Telephone Encounter (Signed)
I was not sure to to ask---- do you know how to get the pt the cologuard ?

## 2020-06-19 DIAGNOSIS — M47816 Spondylosis without myelopathy or radiculopathy, lumbar region: Secondary | ICD-10-CM | POA: Diagnosis not present

## 2020-06-19 DIAGNOSIS — M419 Scoliosis, unspecified: Secondary | ICD-10-CM | POA: Diagnosis not present

## 2020-06-19 DIAGNOSIS — Z6828 Body mass index (BMI) 28.0-28.9, adult: Secondary | ICD-10-CM | POA: Diagnosis not present

## 2020-06-19 DIAGNOSIS — M545 Low back pain: Secondary | ICD-10-CM | POA: Diagnosis not present

## 2020-06-24 ENCOUNTER — Other Ambulatory Visit: Payer: Self-pay | Admitting: Family Medicine

## 2020-06-24 DIAGNOSIS — F419 Anxiety disorder, unspecified: Secondary | ICD-10-CM

## 2020-06-25 ENCOUNTER — Encounter: Payer: Self-pay | Admitting: Family Medicine

## 2020-06-25 DIAGNOSIS — Z1211 Encounter for screening for malignant neoplasm of colon: Secondary | ICD-10-CM | POA: Diagnosis not present

## 2020-06-25 LAB — COLOGUARD: Cologuard: POSITIVE — AB

## 2020-07-03 DIAGNOSIS — H02834 Dermatochalasis of left upper eyelid: Secondary | ICD-10-CM | POA: Diagnosis not present

## 2020-07-03 DIAGNOSIS — H02831 Dermatochalasis of right upper eyelid: Secondary | ICD-10-CM | POA: Diagnosis not present

## 2020-07-05 DIAGNOSIS — M545 Low back pain: Secondary | ICD-10-CM | POA: Diagnosis not present

## 2020-07-05 DIAGNOSIS — M6281 Muscle weakness (generalized): Secondary | ICD-10-CM | POA: Diagnosis not present

## 2020-07-08 DIAGNOSIS — M545 Low back pain: Secondary | ICD-10-CM | POA: Diagnosis not present

## 2020-07-08 DIAGNOSIS — M6281 Muscle weakness (generalized): Secondary | ICD-10-CM | POA: Diagnosis not present

## 2020-07-10 ENCOUNTER — Other Ambulatory Visit: Payer: Self-pay

## 2020-07-10 DIAGNOSIS — R195 Other fecal abnormalities: Secondary | ICD-10-CM

## 2020-07-10 DIAGNOSIS — M545 Low back pain: Secondary | ICD-10-CM | POA: Diagnosis not present

## 2020-07-10 DIAGNOSIS — M6281 Muscle weakness (generalized): Secondary | ICD-10-CM | POA: Diagnosis not present

## 2020-07-14 ENCOUNTER — Other Ambulatory Visit: Payer: Self-pay | Admitting: Family Medicine

## 2020-07-14 DIAGNOSIS — F321 Major depressive disorder, single episode, moderate: Secondary | ICD-10-CM

## 2020-07-15 DIAGNOSIS — H02831 Dermatochalasis of right upper eyelid: Secondary | ICD-10-CM | POA: Diagnosis not present

## 2020-07-15 DIAGNOSIS — H538 Other visual disturbances: Secondary | ICD-10-CM | POA: Diagnosis not present

## 2020-07-15 DIAGNOSIS — H02834 Dermatochalasis of left upper eyelid: Secondary | ICD-10-CM | POA: Diagnosis not present

## 2020-07-16 ENCOUNTER — Telehealth: Payer: Self-pay | Admitting: Family Medicine

## 2020-07-16 NOTE — Telephone Encounter (Signed)
Patient states she would like her cologuard results.

## 2020-07-16 NOTE — Telephone Encounter (Signed)
Spoke with patient.Advised pt of positive results and of GI referral. Pt advised that GI left a VM to call back to schedule. Pt states she will call back to schedule

## 2020-07-17 ENCOUNTER — Encounter: Payer: Self-pay | Admitting: Gastroenterology

## 2020-07-17 ENCOUNTER — Other Ambulatory Visit (HOSPITAL_BASED_OUTPATIENT_CLINIC_OR_DEPARTMENT_OTHER): Payer: Self-pay | Admitting: Family Medicine

## 2020-07-17 DIAGNOSIS — Z1231 Encounter for screening mammogram for malignant neoplasm of breast: Secondary | ICD-10-CM

## 2020-08-05 ENCOUNTER — Ambulatory Visit (HOSPITAL_BASED_OUTPATIENT_CLINIC_OR_DEPARTMENT_OTHER): Payer: Medicare PPO

## 2020-08-13 ENCOUNTER — Telehealth: Payer: Self-pay | Admitting: Family Medicine

## 2020-08-13 ENCOUNTER — Other Ambulatory Visit: Payer: Self-pay | Admitting: Family Medicine

## 2020-08-13 DIAGNOSIS — E669 Obesity, unspecified: Secondary | ICD-10-CM

## 2020-08-13 NOTE — Telephone Encounter (Signed)
Please advise 

## 2020-08-13 NOTE — Telephone Encounter (Signed)
Patient would like a referral to the weight loss clinic

## 2020-08-13 NOTE — Telephone Encounter (Signed)
Checking the status of medication  levothyroxine (SYNTHROID) 75 MCG tablet [[id number]]   [id number] - [place], [place] - [address] AT [facility] [address]  [address], [place] [address]  Phone:  [phone] Fax:  [phone]

## 2020-08-13 NOTE — Telephone Encounter (Signed)
As long as their bmi is >30 ok to put referrals in for weight loss clinic --- referral placed

## 2020-08-14 NOTE — Telephone Encounter (Signed)
Pt called. VM left letting patient know that the referral was placed

## 2020-08-16 ENCOUNTER — Encounter: Payer: Self-pay | Admitting: *Deleted

## 2020-09-09 ENCOUNTER — Ambulatory Visit (HOSPITAL_BASED_OUTPATIENT_CLINIC_OR_DEPARTMENT_OTHER): Payer: Medicare PPO

## 2020-09-16 ENCOUNTER — Encounter: Payer: Medicare PPO | Admitting: Gastroenterology

## 2020-09-30 ENCOUNTER — Encounter: Payer: Self-pay | Admitting: Family Medicine

## 2020-09-30 ENCOUNTER — Encounter (HOSPITAL_BASED_OUTPATIENT_CLINIC_OR_DEPARTMENT_OTHER): Payer: Self-pay

## 2020-09-30 ENCOUNTER — Ambulatory Visit (HOSPITAL_BASED_OUTPATIENT_CLINIC_OR_DEPARTMENT_OTHER)
Admission: RE | Admit: 2020-09-30 | Discharge: 2020-09-30 | Disposition: A | Discharge status: Home / Self Care | Payer: Medicare PPO | Source: Ambulatory Visit | Attending: Family Medicine | Admitting: Family Medicine

## 2020-09-30 DIAGNOSIS — Z1231 Encounter for screening mammogram for malignant neoplasm of breast: Secondary | ICD-10-CM | POA: Insufficient documentation

## 2020-10-12 ENCOUNTER — Other Ambulatory Visit: Payer: Self-pay | Admitting: Family Medicine

## 2020-10-12 DIAGNOSIS — F321 Major depressive disorder, single episode, moderate: Secondary | ICD-10-CM

## 2020-10-29 ENCOUNTER — Encounter: Payer: Medicare PPO | Admitting: Gastroenterology

## 2020-10-31 DIAGNOSIS — M1712 Unilateral primary osteoarthritis, left knee: Secondary | ICD-10-CM | POA: Diagnosis not present

## 2020-10-31 DIAGNOSIS — M7122 Synovial cyst of popliteal space [Baker], left knee: Secondary | ICD-10-CM | POA: Diagnosis not present

## 2020-10-31 DIAGNOSIS — M65262 Calcific tendinitis, left lower leg: Secondary | ICD-10-CM | POA: Diagnosis not present

## 2020-11-13 DIAGNOSIS — M71562 Other bursitis, not elsewhere classified, left knee: Secondary | ICD-10-CM | POA: Diagnosis not present

## 2020-11-13 DIAGNOSIS — R600 Localized edema: Secondary | ICD-10-CM | POA: Diagnosis not present

## 2020-11-13 DIAGNOSIS — M7122 Synovial cyst of popliteal space [Baker], left knee: Secondary | ICD-10-CM | POA: Diagnosis not present

## 2020-11-13 DIAGNOSIS — M25562 Pain in left knee: Secondary | ICD-10-CM | POA: Diagnosis not present

## 2020-11-13 DIAGNOSIS — M25462 Effusion, left knee: Secondary | ICD-10-CM | POA: Diagnosis not present

## 2020-11-15 ENCOUNTER — Other Ambulatory Visit: Payer: Self-pay | Admitting: Family Medicine

## 2020-11-20 ENCOUNTER — Other Ambulatory Visit: Payer: Self-pay | Admitting: Family Medicine

## 2020-11-20 DIAGNOSIS — F321 Major depressive disorder, single episode, moderate: Secondary | ICD-10-CM

## 2020-11-22 DIAGNOSIS — R195 Other fecal abnormalities: Secondary | ICD-10-CM | POA: Diagnosis not present

## 2020-11-25 DIAGNOSIS — M7052 Other bursitis of knee, left knee: Secondary | ICD-10-CM | POA: Diagnosis not present

## 2020-11-25 DIAGNOSIS — M1712 Unilateral primary osteoarthritis, left knee: Secondary | ICD-10-CM | POA: Diagnosis not present

## 2020-11-26 DIAGNOSIS — M7052 Other bursitis of knee, left knee: Secondary | ICD-10-CM | POA: Diagnosis not present

## 2020-11-26 DIAGNOSIS — M7732 Calcaneal spur, left foot: Secondary | ICD-10-CM | POA: Diagnosis not present

## 2020-11-26 DIAGNOSIS — M722 Plantar fascial fibromatosis: Secondary | ICD-10-CM | POA: Diagnosis not present

## 2020-11-26 DIAGNOSIS — M1712 Unilateral primary osteoarthritis, left knee: Secondary | ICD-10-CM | POA: Diagnosis not present

## 2020-12-22 ENCOUNTER — Other Ambulatory Visit: Payer: Self-pay | Admitting: Family Medicine

## 2020-12-22 DIAGNOSIS — F321 Major depressive disorder, single episode, moderate: Secondary | ICD-10-CM

## 2020-12-26 ENCOUNTER — Other Ambulatory Visit: Payer: Self-pay | Admitting: Family Medicine

## 2020-12-26 DIAGNOSIS — F321 Major depressive disorder, single episode, moderate: Secondary | ICD-10-CM

## 2021-01-02 ENCOUNTER — Telehealth: Payer: Self-pay | Admitting: Family Medicine

## 2021-01-02 DIAGNOSIS — F419 Anxiety disorder, unspecified: Secondary | ICD-10-CM

## 2021-01-02 MED ORDER — ESCITALOPRAM OXALATE 10 MG PO TABS: ORAL_TABLET | ORAL | 0 refills | Status: DC

## 2021-01-02 NOTE — Telephone Encounter (Signed)
Medication escitalopram (LEXAPRO) 10 MG tablet [[id number]]      Has the patient contacted their pharmacy? no (If no, request that the patient contact the pharmacy for the refill.) (If yes, when and what did the pharmacy advise?)    Preferred Pharmacy (with phone number or street name): [id number] - [place], [place] - [address] AT [place] Phone:  [phone]  Fax:  [phone]          Agent: Please be advised that RX refills may take up to 3 business days. We ask that you follow-up with your pharmacy.

## 2021-01-02 NOTE — Telephone Encounter (Signed)
Refill sent.

## 2021-01-09 ENCOUNTER — Ambulatory Visit: Payer: Medicare PPO | Admitting: Family Medicine

## 2021-01-10 ENCOUNTER — Ambulatory Visit: Payer: Medicare PPO | Admitting: Family Medicine

## 2021-01-10 ENCOUNTER — Encounter: Payer: Self-pay | Admitting: Family Medicine

## 2021-01-10 ENCOUNTER — Other Ambulatory Visit: Payer: Self-pay

## 2021-01-10 VITALS — BP 118/80 | HR 69 | Temp 97.8°F | Resp 18 | Ht 65.0 in | Wt 189.4 lb

## 2021-01-10 DIAGNOSIS — F419 Anxiety disorder, unspecified: Secondary | ICD-10-CM

## 2021-01-10 DIAGNOSIS — R252 Cramp and spasm: Secondary | ICD-10-CM | POA: Diagnosis not present

## 2021-01-10 DIAGNOSIS — F321 Major depressive disorder, single episode, moderate: Secondary | ICD-10-CM

## 2021-01-10 MED ORDER — ESCITALOPRAM OXALATE 10 MG PO TABS: ORAL_TABLET | ORAL | 3 refills | Status: DC

## 2021-01-10 MED ORDER — BUPROPION HCL ER (XL) 150 MG PO TB24: ORAL_TABLET | ORAL | 3 refills | Status: DC

## 2021-01-10 NOTE — Patient Instructions (Signed)
Leg Cramps Leg cramps occur when one or more muscles tighten and a person has no control over it (involuntary muscle contraction). Muscle cramps are most common in the calf muscles of the leg. They can occur during exercise or at rest. Leg cramps are painful, and they may last for a few seconds to a few minutes. Cramps may return several times before they finally stop. Usually, leg cramps are not caused by a serious medical problem. In many cases, the cause is not known. Some common causes include:  Excessive physical effort (overexertion), such as during intense exercise.  Doing the same motion over and over.  Staying in a certain position for a long period of time.  Improper preparation, form, or technique while doing a sport or an activity.  Dehydration.  Injury.  Side effects of certain medicines.  Abnormally low levels of minerals in your blood (electrolytes), especially potassium and calcium. This could result from: ? Pregnancy. ? Taking diuretic medicines. Follow these instructions at home: Eating and drinking  Drink enough fluid to keep your urine pale yellow. Staying hydrated may help prevent cramps.  Eat a healthy diet that includes plenty of nutrients to help your muscles function. A healthy diet includes fruits and vegetables, lean protein, whole grains, and low-fat or nonfat dairy products. Managing pain, stiffness, and swelling  Try massaging, stretching, and relaxing the affected muscle. Do this for several minutes at a time.  If directed, put ice on areas that are sore or painful after a cramp. To do this: ? Put ice in a plastic bag. ? Place a towel between your skin and the bag. ? Leave the ice on for 20 minutes, 2-3 times a day. ? Remove the ice if your skin turns bright red. This is very important. If you cannot feel pain, heat, or cold, you have a greater risk of damage to the area.  If directed, apply heat to muscles that are tense or tight. Do this before  you exercise, or as often as told by your health care provider. Use the heat source that your health care provider recommends, such as a moist heat pack or a heating pad. To do this: ? Place a towel between your skin and the heat source. ? Leave the heat on for 20-30 minutes. ? Remove the heat if your skin turns bright red. This is especially important if you are unable to feel pain, heat, or cold. You may have a greater risk of getting burned.  Try taking hot showers or baths to help relax tight muscles.      General instructions  If you are having frequent leg cramps, avoid intense exercise for several days.  Take over-the-counter and prescription medicines only as told by your health care provider.  Keep all follow-up visits. This is important. Contact a health care provider if:  Your leg cramps get more severe or more frequent, or they do not improve over time.  Your foot becomes cold, numb, or blue. Summary  Muscle cramps can develop in any muscle, but the most common place is in the calf muscles of the leg.  Leg cramps are painful, and they may last for a few seconds to a few minutes.  Usually, leg cramps are not caused by a serious medical problem. Often, the cause is not known.  Stay hydrated, and take over-the-counter and prescription medicines only as told by your health care provider. This information is not intended to replace advice given to you by your   health care provider. Make sure you discuss any questions you have with your health care provider. Document Revised: 02/21/2020 Document Reviewed: 02/21/2020 Elsevier Patient Education  2021 Elsevier Inc.  

## 2021-01-10 NOTE — Assessment & Plan Note (Signed)
Stable con't wellbutrin and lexapro

## 2021-01-10 NOTE — Assessment & Plan Note (Signed)
Check labs today Recommended hyland for leg cramps rto if no improvement

## 2021-01-10 NOTE — Progress Notes (Signed)
Subjective:    Patient ID: Denise Lowery, female    DOB: 1945/11/17, 75 y.o.   MRN: [id number]  No chief complaint on file.   HPI Patient is in today for f/u depression -- she is doing well with the meds--no complaints there---Pt also complains of leg cramps the last 2 weeks --only at night   Past Medical History:  Diagnosis Date  . Back pain   . Bronchitis   . Depression   . Environmental allergies   . GERD (gastroesophageal reflux disease)   . Scoliosis   . Thyroid disease     Past Surgical History:  Procedure Laterality Date  . APPENDECTOMY  1972  . CATARACT EXTRACTION, BILATERAL  October and December 2017   Dr. Antionette Fairy  . KNEE SURGERY     RIGHT  . TONSILLECTOMY  1998    Family History  Problem Relation Age of Onset  . Stroke Father 28  . Heart disease Brother 64       MI  . Heart disease Cousin 74       MI    Social History   Socioeconomic History  . Marital status: Single    Spouse name: Not on file  . Number of children: Not on file  . Years of education: Not on file  . Highest education level: Not on file  Occupational History  . Occupation: Editor, commissioning , [employer]  Tobacco Use  . Smoking status: Never Smoker  . Smokeless tobacco: Never Used  Substance and Sexual Activity  . Alcohol use: Yes    Alcohol/week: 5.0 standard drinks    Types: 5 Glasses of wine per week    Comment: Socially  . Drug use: No  . Sexual activity: Not Currently    Partners: Male  Other Topics Concern  . Not on file  Social History Narrative   Exercise-- 3x a week,  Walking, weights, ellipitical, bike       Social Determinants of Health   Financial Resource Strain: Low Risk   . Difficulty of Paying Living Expenses: Not hard at all  Food Insecurity: No Food Insecurity  . Worried About Charity fundraiser in the Last Year: Never true  . Ran Out of Food in the Last Year: Never true  Transportation Needs: No Transportation Needs  . Lack of Transportation  (Medical): No  . Lack of Transportation (Non-Medical): No  Physical Activity: Not on file  Stress: Not on file  Social Connections: Not on file  Intimate Partner Violence: Not on file    Outpatient Medications Prior to Visit  Medication Sig Dispense Refill  . albuterol (VENTOLIN HFA) 108 (90 Base) MCG/ACT inhaler ProAir HFA 90 mcg/actuation aerosol inhaler  INL 1 TO 2 PFS PO Q 4 TO 6 H PRN    . Calcium Carbonate Antacid (TUMS PO) Take 1 tablet by mouth as needed.    . cetirizine (ZYRTEC) 10 MG tablet Take 1 tablet (10 mg total) by mouth daily. 30 tablet 11  . esomeprazole (NEXIUM) 40 MG capsule TAKE 1 CAPSULE(40 MG) BY MOUTH DAILY 90 capsule 1  . fluticasone (FLONASE) 50 MCG/ACT nasal spray Place 2 sprays into both nostrils daily. 16 g 6  . levothyroxine (SYNTHROID) 75 MCG tablet TAKE 1 TABLET BY MOUTH DAILY 90 tablet 0  . buPROPion (WELLBUTRIN XL) 150 MG 24 hr tablet TAKE 1 TABLET(150 MG) BY MOUTH DAILY 15 tablet 0  . escitalopram (LEXAPRO) 10 MG tablet TAKE 1 TABLET(10 MG) BY  MOUTH AT BEDTIME 30 tablet 0   Facility-Administered Medications Prior to Visit  Medication Dose Route Frequency Provider Last Rate Last Admin  . 0.9 %  sodium chloride infusion  500 mL Intravenous Once Ladene Artist, MD        Allergies  Allergen Reactions  . Agrostis Alba Pollen Extract [Gramineae Pollens] Other (See Comments)  . Penicillins Itching and Swelling    Swelling of lips Itching on palms of hand    Review of Systems  Constitutional: Negative for chills, fever and malaise/fatigue.  HENT: Negative for congestion and hearing loss.   Eyes: Negative for discharge.  Respiratory: Negative for cough, sputum production and shortness of breath.   Cardiovascular: Negative for chest pain, palpitations and leg swelling.  Gastrointestinal: Negative for abdominal pain, blood in stool, constipation, diarrhea, heartburn, nausea and vomiting.  Genitourinary: Negative for dysuria, frequency, hematuria and  urgency.  Musculoskeletal: Negative for back pain, falls and myalgias.  Skin: Negative for rash.  Neurological: Negative for dizziness, sensory change, loss of consciousness, weakness and headaches.  Endo/Heme/Allergies: Negative for environmental allergies. Does not bruise/bleed easily.  Psychiatric/Behavioral: Negative for depression and suicidal ideas. The patient is not nervous/anxious and does not have insomnia.        Objective:    Physical Exam Vitals and nursing note reviewed.  Constitutional:      Appearance: She is well-developed.  HENT:     Head: Normocephalic and atraumatic.  Eyes:     Conjunctiva/sclera: Conjunctivae normal.  Neck:     Thyroid: No thyromegaly.     Vascular: No carotid bruit or JVD.  Cardiovascular:     Rate and Rhythm: Normal rate and regular rhythm.     Heart sounds: Normal heart sounds. No murmur heard.   Pulmonary:     Effort: Pulmonary effort is normal. No respiratory distress.     Breath sounds: Normal breath sounds. No wheezing or rales.  Chest:     Chest wall: No tenderness.  Musculoskeletal:     Cervical back: Normal range of motion and neck supple.  Neurological:     Mental Status: She is alert and oriented to person, place, and time.     BP 118/80 (BP Location: Right Arm, Patient Position: Sitting, Cuff Size: Normal)   Pulse 69   Temp 97.8 F (36.6 C) (Oral)   Resp 18   Ht 5\' 5"  (1.651 m)   Wt 189 lb 6.4 oz (85.9 kg)   SpO2 96%   BMI 31.52 kg/m  Wt Readings from Last 3 Encounters:  01/10/21 189 lb 6.4 oz (85.9 kg)  02/06/20 181 lb 12.8 oz (82.5 kg)  06/05/19 185 lb 3.2 oz (84 kg)    Diabetic Foot Exam - Simple   No data filed    Lab Results  Component Value Date   WBC 5.8 06/08/2017   HGB 12.9 06/08/2017   HCT 38.7 06/08/2017   PLT 289.0 06/08/2017   GLUCOSE 81 02/06/2020   CHOL 207 (H) 02/06/2020   TRIG 90.0 02/06/2020   HDL 63.90 02/06/2020   LDLCALC 125 (H) 02/06/2020   ALT 12 02/06/2020   AST 18  02/06/2020   NA 137 02/06/2020   K 4.2 02/06/2020   CL 101 02/06/2020   CREATININE 0.77 02/06/2020   BUN 8 02/06/2020   CO2 31 02/06/2020   TSH 0.89 02/06/2020    Lab Results  Component Value Date   TSH 0.89 02/06/2020   Lab Results  Component Value Date  WBC 5.8 06/08/2017   HGB 12.9 06/08/2017   HCT 38.7 06/08/2017   MCV 97.6 06/08/2017   PLT 289.0 06/08/2017   Lab Results  Component Value Date   NA 137 02/06/2020   K 4.2 02/06/2020   CO2 31 02/06/2020   GLUCOSE 81 02/06/2020   BUN 8 02/06/2020   CREATININE 0.77 02/06/2020   BILITOT 0.8 02/06/2020   ALKPHOS 78 02/06/2020   AST 18 02/06/2020   ALT 12 02/06/2020   PROT 6.5 02/06/2020   ALBUMIN 4.3 02/06/2020   CALCIUM 9.8 02/06/2020   GFR 73.59 02/06/2020   Lab Results  Component Value Date   CHOL 207 (H) 02/06/2020   Lab Results  Component Value Date   HDL 63.90 02/06/2020   Lab Results  Component Value Date   LDLCALC 125 (H) 02/06/2020   Lab Results  Component Value Date   TRIG 90.0 02/06/2020   Lab Results  Component Value Date   CHOLHDL 3 02/06/2020   No results found for: HGBA1C     Assessment & Plan:   Problem List Items Addressed This Visit      Unprioritized   Depression, major, single episode, moderate (HCC)   Relevant Medications   buPROPion (WELLBUTRIN XL) 150 MG 24 hr tablet   escitalopram (LEXAPRO) 10 MG tablet    Other Visit Diagnoses    Leg cramps    -  Primary   Relevant Orders   Comprehensive metabolic panel   Magnesium   Phosphorus   Anxiety       Relevant Medications   buPROPion (WELLBUTRIN XL) 150 MG 24 hr tablet   escitalopram (LEXAPRO) 10 MG tablet      I am having Denise Lowery maintain her Calcium Carbonate Antacid (TUMS PO), albuterol, esomeprazole, cetirizine, fluticasone, levothyroxine, buPROPion, and escitalopram. We will continue to administer sodium chloride.  Meds ordered this encounter  Medications  . buPROPion (WELLBUTRIN XL) 150 MG 24 hr tablet     Sig: TAKE 1 TABLET(150 MG) BY MOUTH DAILY    Dispense:  90 tablet    Refill:  3  . escitalopram (LEXAPRO) 10 MG tablet    Sig: TAKE 1 TABLET(10 MG) BY MOUTH AT BEDTIME    Dispense:  90 tablet    Refill:  3     Ann Held, DO

## 2021-01-11 LAB — COMPREHENSIVE METABOLIC PANEL
AG Ratio: 1.9 (calc) (ref 1.0–2.5)
ALT: 15 U/L (ref 6–29)
AST: 18 U/L (ref 10–35)
Albumin: 4.1 g/dL (ref 3.6–5.1)
Alkaline phosphatase (APISO): 79 U/L (ref 37–153)
BUN: 13 mg/dL (ref 7–25)
CO2: 30 mmol/L (ref 20–32)
Calcium: 10.2 mg/dL (ref 8.6–10.4)
Chloride: 103 mmol/L (ref 98–110)
Creat: 0.63 mg/dL (ref 0.60–0.93)
Globulin: 2.2 g/dL (calc) (ref 1.9–3.7)
Glucose, Bld: 81 mg/dL (ref 65–99)
Potassium: 4.9 mmol/L (ref 3.5–5.3)
Sodium: 140 mmol/L (ref 135–146)
Total Bilirubin: 0.8 mg/dL (ref 0.2–1.2)
Total Protein: 6.3 g/dL (ref 6.1–8.1)

## 2021-01-11 LAB — MAGNESIUM: Magnesium: 2.3 mg/dL (ref 1.5–2.5)

## 2021-01-11 LAB — PHOSPHORUS: Phosphorus: 3.8 mg/dL (ref 2.1–4.3)

## 2021-01-13 ENCOUNTER — Telehealth: Payer: Self-pay | Admitting: Family Medicine

## 2021-01-13 NOTE — Telephone Encounter (Signed)
Patient would like to know if she is able to start taking activia shake (in hopes to lose weight). Activia contains soy she wants to make sure is not going to cause problems with her thyroid medication.

## 2021-01-13 NOTE — Telephone Encounter (Signed)
There is not enough in it to cause a problem

## 2021-01-15 NOTE — Telephone Encounter (Signed)
Spoke with patient. Pt verbalized understanding  °

## 2021-01-20 ENCOUNTER — Telehealth: Payer: Self-pay | Admitting: Family Medicine

## 2021-01-20 NOTE — Telephone Encounter (Signed)
Pt called. Left normal results on VM

## 2021-01-20 NOTE — Telephone Encounter (Signed)
Patient would like lab results. She has not received letter yet.

## 2021-01-31 ENCOUNTER — Other Ambulatory Visit: Payer: Self-pay | Admitting: Family Medicine

## 2021-01-31 DIAGNOSIS — F419 Anxiety disorder, unspecified: Secondary | ICD-10-CM

## 2021-02-10 ENCOUNTER — Other Ambulatory Visit: Payer: Self-pay | Admitting: Family Medicine

## 2021-02-27 DIAGNOSIS — D3132 Benign neoplasm of left choroid: Secondary | ICD-10-CM | POA: Diagnosis not present

## 2021-02-27 DIAGNOSIS — H04123 Dry eye syndrome of bilateral lacrimal glands: Secondary | ICD-10-CM | POA: Diagnosis not present

## 2021-02-27 DIAGNOSIS — H43813 Vitreous degeneration, bilateral: Secondary | ICD-10-CM | POA: Diagnosis not present

## 2021-02-27 DIAGNOSIS — H02054 Trichiasis without entropian left upper eyelid: Secondary | ICD-10-CM | POA: Diagnosis not present

## 2021-02-27 DIAGNOSIS — Z961 Presence of intraocular lens: Secondary | ICD-10-CM | POA: Diagnosis not present

## 2021-02-28 DIAGNOSIS — K5900 Constipation, unspecified: Secondary | ICD-10-CM | POA: Diagnosis not present

## 2021-02-28 DIAGNOSIS — Z538 Procedure and treatment not carried out for other reasons: Secondary | ICD-10-CM | POA: Diagnosis not present

## 2021-02-28 DIAGNOSIS — K5730 Diverticulosis of large intestine without perforation or abscess without bleeding: Secondary | ICD-10-CM | POA: Diagnosis not present

## 2021-02-28 DIAGNOSIS — K640 First degree hemorrhoids: Secondary | ICD-10-CM | POA: Diagnosis not present

## 2021-02-28 DIAGNOSIS — K649 Unspecified hemorrhoids: Secondary | ICD-10-CM | POA: Diagnosis not present

## 2021-02-28 DIAGNOSIS — R195 Other fecal abnormalities: Secondary | ICD-10-CM | POA: Diagnosis not present

## 2021-02-28 LAB — HM COLONOSCOPY

## 2021-04-17 DIAGNOSIS — L570 Actinic keratosis: Secondary | ICD-10-CM | POA: Diagnosis not present

## 2021-04-17 DIAGNOSIS — L820 Inflamed seborrheic keratosis: Secondary | ICD-10-CM | POA: Diagnosis not present

## 2021-04-17 DIAGNOSIS — D485 Neoplasm of uncertain behavior of skin: Secondary | ICD-10-CM | POA: Diagnosis not present

## 2021-04-17 DIAGNOSIS — L814 Other melanin hyperpigmentation: Secondary | ICD-10-CM | POA: Diagnosis not present

## 2021-04-23 DIAGNOSIS — D485 Neoplasm of uncertain behavior of skin: Secondary | ICD-10-CM | POA: Diagnosis not present

## 2021-05-11 ENCOUNTER — Other Ambulatory Visit: Payer: Self-pay | Admitting: Family Medicine

## 2021-05-26 ENCOUNTER — Telehealth: Payer: Self-pay | Admitting: Family Medicine

## 2021-05-26 DIAGNOSIS — F419 Anxiety disorder, unspecified: Secondary | ICD-10-CM

## 2021-05-26 DIAGNOSIS — F321 Major depressive disorder, single episode, moderate: Secondary | ICD-10-CM

## 2021-05-26 NOTE — Telephone Encounter (Signed)
[place] 3 prescriptions refilled so they can last her while she looks for a new provider in her new location levothyroxine (SYNTHROID) 75 MCG tablet  / escitalopram (LEXAPRO) 10 MG tablet /  buPROPion (WELLBUTRIN XL) 150 MG 24 hr tablet  She would like for it to be sent to [facility], [place] - [address] AT [place]  [address], [place] [address]  Phone:  [phone]  Fax:  [phone]  The pharmacy told her they would transfer it to her new Pharmacy at her new location. Thank you

## 2021-05-27 MED ORDER — BUPROPION HCL ER (XL) 150 MG PO TB24: ORAL_TABLET | ORAL | 1 refills | Status: DC

## 2021-05-27 MED ORDER — ESCITALOPRAM OXALATE 10 MG PO TABS: 10.0000 mg | ORAL_TABLET | Freq: Every day | ORAL | 1 refills | Status: AC

## 2021-05-27 MED ORDER — LEVOTHYROXINE SODIUM 75 MCG PO TABS: 75.0000 ug | ORAL_TABLET | Freq: Every day | ORAL | 1 refills | Status: AC

## 2021-05-27 NOTE — Telephone Encounter (Signed)
Refills sent

## 2021-06-11 DIAGNOSIS — M9902 Segmental and somatic dysfunction of thoracic region: Secondary | ICD-10-CM | POA: Diagnosis not present

## 2021-06-11 DIAGNOSIS — M5124 Other intervertebral disc displacement, thoracic region: Secondary | ICD-10-CM | POA: Diagnosis not present

## 2021-06-11 DIAGNOSIS — M5137 Other intervertebral disc degeneration, lumbosacral region: Secondary | ICD-10-CM | POA: Diagnosis not present

## 2021-06-11 DIAGNOSIS — M9901 Segmental and somatic dysfunction of cervical region: Secondary | ICD-10-CM | POA: Diagnosis not present

## 2021-06-11 DIAGNOSIS — M5030 Other cervical disc degeneration, unspecified cervical region: Secondary | ICD-10-CM | POA: Diagnosis not present

## 2021-06-11 DIAGNOSIS — M9903 Segmental and somatic dysfunction of lumbar region: Secondary | ICD-10-CM | POA: Diagnosis not present

## 2021-06-18 DIAGNOSIS — M5030 Other cervical disc degeneration, unspecified cervical region: Secondary | ICD-10-CM | POA: Diagnosis not present

## 2021-06-18 DIAGNOSIS — M5137 Other intervertebral disc degeneration, lumbosacral region: Secondary | ICD-10-CM | POA: Diagnosis not present

## 2021-06-18 DIAGNOSIS — M5124 Other intervertebral disc displacement, thoracic region: Secondary | ICD-10-CM | POA: Diagnosis not present

## 2021-06-18 DIAGNOSIS — M9903 Segmental and somatic dysfunction of lumbar region: Secondary | ICD-10-CM | POA: Diagnosis not present

## 2021-06-18 DIAGNOSIS — M9902 Segmental and somatic dysfunction of thoracic region: Secondary | ICD-10-CM | POA: Diagnosis not present

## 2021-06-18 DIAGNOSIS — M9901 Segmental and somatic dysfunction of cervical region: Secondary | ICD-10-CM | POA: Diagnosis not present

## 2021-07-16 DIAGNOSIS — M9903 Segmental and somatic dysfunction of lumbar region: Secondary | ICD-10-CM | POA: Diagnosis not present

## 2021-07-16 DIAGNOSIS — M5124 Other intervertebral disc displacement, thoracic region: Secondary | ICD-10-CM | POA: Diagnosis not present

## 2021-07-16 DIAGNOSIS — M5030 Other cervical disc degeneration, unspecified cervical region: Secondary | ICD-10-CM | POA: Diagnosis not present

## 2021-07-16 DIAGNOSIS — M9902 Segmental and somatic dysfunction of thoracic region: Secondary | ICD-10-CM | POA: Diagnosis not present

## 2021-07-16 DIAGNOSIS — M5137 Other intervertebral disc degeneration, lumbosacral region: Secondary | ICD-10-CM | POA: Diagnosis not present

## 2021-07-16 DIAGNOSIS — M9901 Segmental and somatic dysfunction of cervical region: Secondary | ICD-10-CM | POA: Diagnosis not present

## 2021-07-25 DIAGNOSIS — R195 Other fecal abnormalities: Secondary | ICD-10-CM | POA: Diagnosis not present

## 2021-07-25 DIAGNOSIS — E039 Hypothyroidism, unspecified: Secondary | ICD-10-CM | POA: Diagnosis not present

## 2021-07-25 DIAGNOSIS — F325 Major depressive disorder, single episode, in full remission: Secondary | ICD-10-CM | POA: Diagnosis not present

## 2021-07-25 DIAGNOSIS — K219 Gastro-esophageal reflux disease without esophagitis: Secondary | ICD-10-CM | POA: Diagnosis not present

## 2021-07-25 DIAGNOSIS — L989 Disorder of the skin and subcutaneous tissue, unspecified: Secondary | ICD-10-CM | POA: Diagnosis not present

## 2021-07-25 DIAGNOSIS — Z1231 Encounter for screening mammogram for malignant neoplasm of breast: Secondary | ICD-10-CM | POA: Diagnosis not present

## 2021-07-25 DIAGNOSIS — Z1211 Encounter for screening for malignant neoplasm of colon: Secondary | ICD-10-CM | POA: Diagnosis not present

## 2021-07-25 DIAGNOSIS — J302 Other seasonal allergic rhinitis: Secondary | ICD-10-CM | POA: Diagnosis not present

## 2021-07-25 DIAGNOSIS — Z13220 Encounter for screening for lipoid disorders: Secondary | ICD-10-CM | POA: Diagnosis not present

## 2021-08-12 DIAGNOSIS — M9901 Segmental and somatic dysfunction of cervical region: Secondary | ICD-10-CM | POA: Diagnosis not present

## 2021-08-12 DIAGNOSIS — M5137 Other intervertebral disc degeneration, lumbosacral region: Secondary | ICD-10-CM | POA: Diagnosis not present

## 2021-08-12 DIAGNOSIS — M5124 Other intervertebral disc displacement, thoracic region: Secondary | ICD-10-CM | POA: Diagnosis not present

## 2021-08-12 DIAGNOSIS — M9902 Segmental and somatic dysfunction of thoracic region: Secondary | ICD-10-CM | POA: Diagnosis not present

## 2021-08-12 DIAGNOSIS — M9903 Segmental and somatic dysfunction of lumbar region: Secondary | ICD-10-CM | POA: Diagnosis not present

## 2021-08-12 DIAGNOSIS — M5030 Other cervical disc degeneration, unspecified cervical region: Secondary | ICD-10-CM | POA: Diagnosis not present

## 2021-08-26 DIAGNOSIS — E039 Hypothyroidism, unspecified: Secondary | ICD-10-CM | POA: Diagnosis not present

## 2021-08-26 DIAGNOSIS — Z136 Encounter for screening for cardiovascular disorders: Secondary | ICD-10-CM | POA: Diagnosis not present

## 2021-08-26 DIAGNOSIS — Z13220 Encounter for screening for lipoid disorders: Secondary | ICD-10-CM | POA: Diagnosis not present

## 2021-08-26 DIAGNOSIS — K219 Gastro-esophageal reflux disease without esophagitis: Secondary | ICD-10-CM | POA: Diagnosis not present

## 2021-09-10 DIAGNOSIS — M5137 Other intervertebral disc degeneration, lumbosacral region: Secondary | ICD-10-CM | POA: Diagnosis not present

## 2021-09-10 DIAGNOSIS — M9902 Segmental and somatic dysfunction of thoracic region: Secondary | ICD-10-CM | POA: Diagnosis not present

## 2021-09-10 DIAGNOSIS — M5124 Other intervertebral disc displacement, thoracic region: Secondary | ICD-10-CM | POA: Diagnosis not present

## 2021-09-10 DIAGNOSIS — M9903 Segmental and somatic dysfunction of lumbar region: Secondary | ICD-10-CM | POA: Diagnosis not present

## 2021-09-10 DIAGNOSIS — M5030 Other cervical disc degeneration, unspecified cervical region: Secondary | ICD-10-CM | POA: Diagnosis not present

## 2021-09-10 DIAGNOSIS — M9901 Segmental and somatic dysfunction of cervical region: Secondary | ICD-10-CM | POA: Diagnosis not present

## 2021-09-17 DIAGNOSIS — Z1231 Encounter for screening mammogram for malignant neoplasm of breast: Secondary | ICD-10-CM | POA: Diagnosis not present

## 2021-09-17 DIAGNOSIS — K219 Gastro-esophageal reflux disease without esophagitis: Secondary | ICD-10-CM | POA: Diagnosis not present

## 2021-09-17 DIAGNOSIS — R195 Other fecal abnormalities: Secondary | ICD-10-CM | POA: Diagnosis not present

## 2021-10-28 IMAGING — MG DIGITAL SCREENING BILAT W/ TOMO W/ CAD
6 of 10 series · 6 of 30 positions shown · non-contrast
Comparison: Previous exam(s).

CLINICAL DATA: Screening.

EXAM:
DIGITAL SCREENING BILATERAL MAMMOGRAM WITH TOMO AND CAD

[L CC synth-2D]
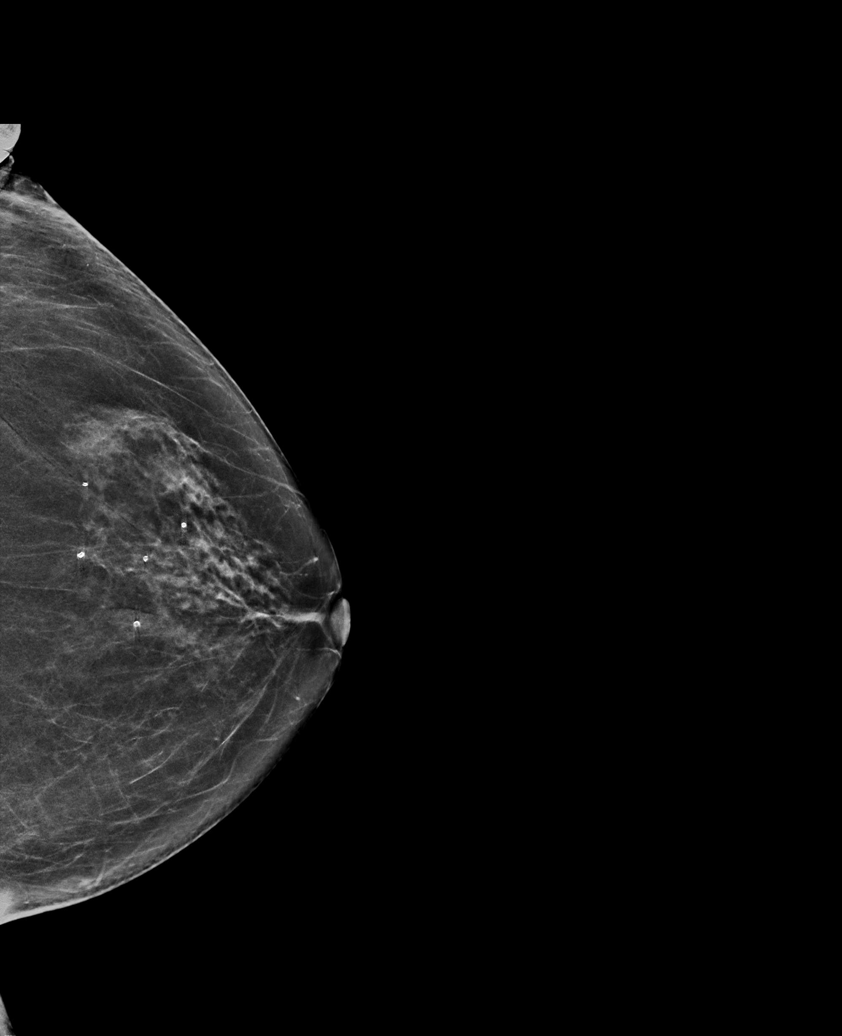

[L MLO synth-2D]
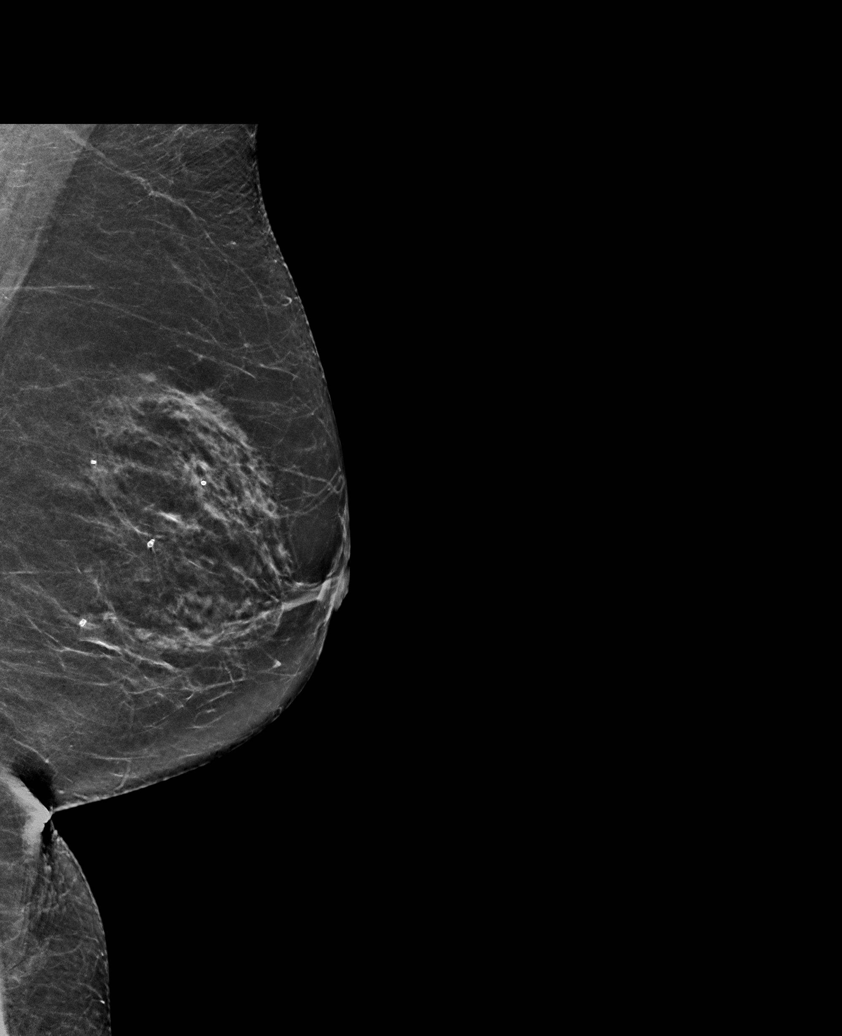

[L XCCM synth-2D]
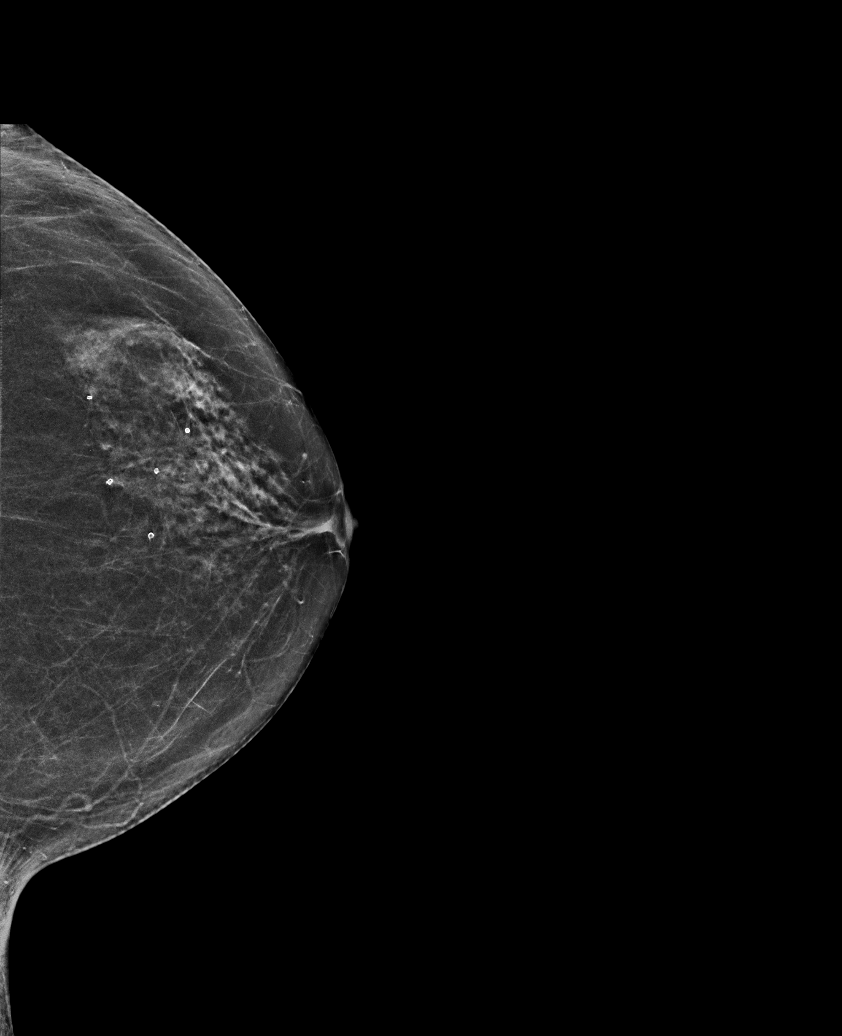

[R CC synth-2D]
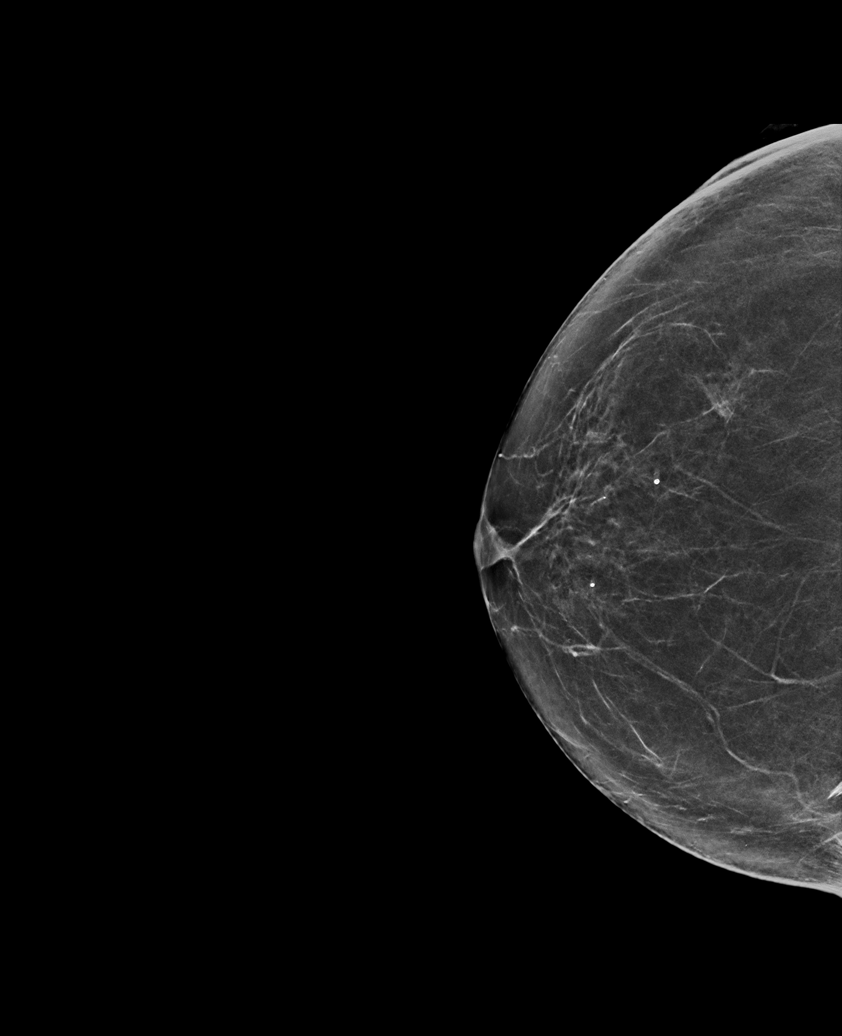

[R MLO synth-2D]
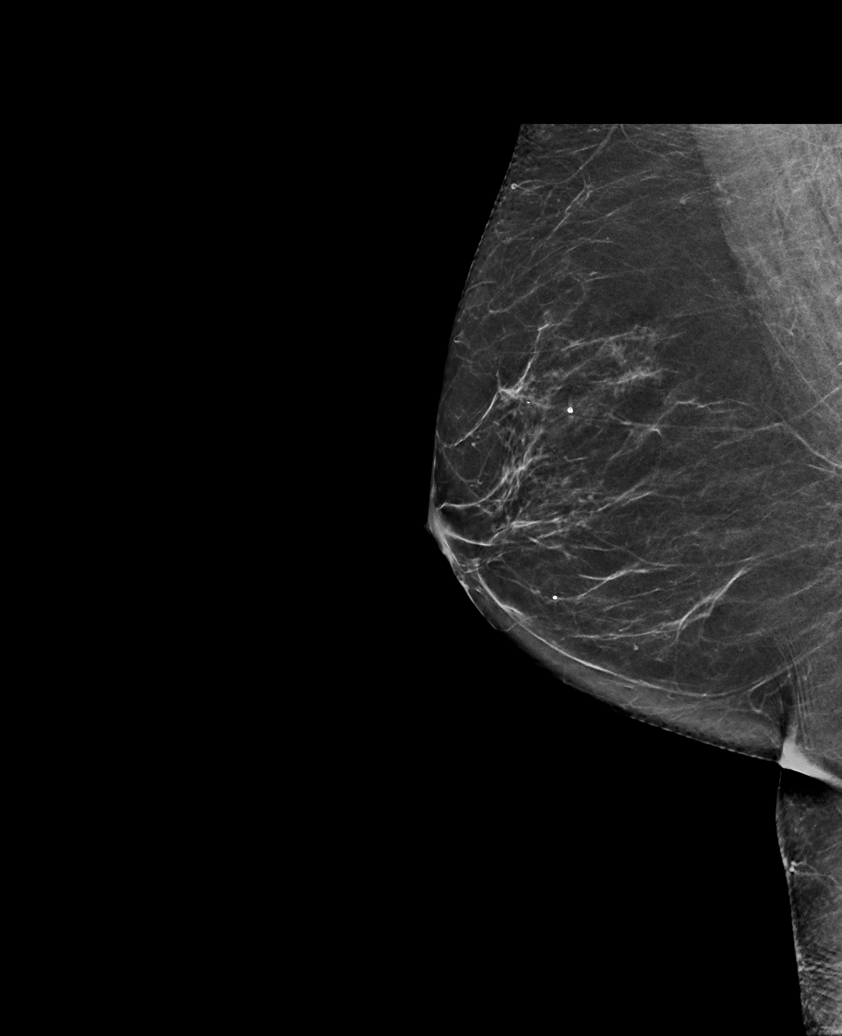

[L MLO tomo · tomo slice 31/62.0]
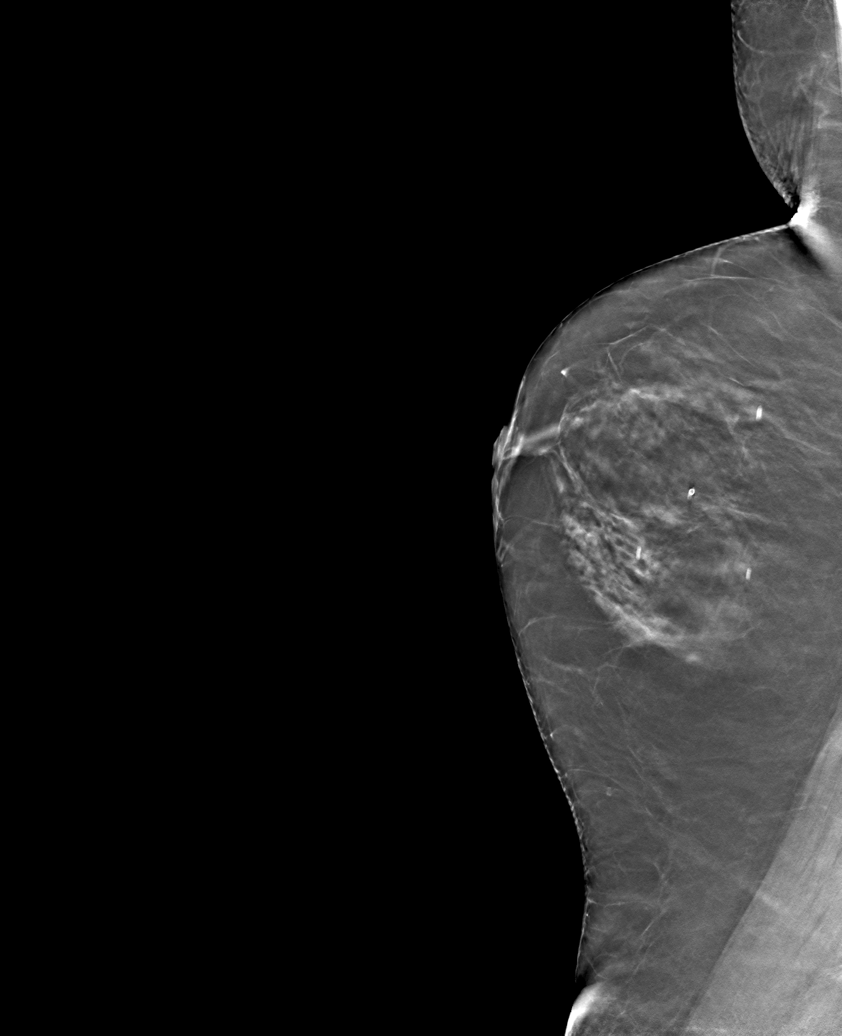

[6 of 30 positions shown; findings below may reference images not displayed]

ACR Breast Density Category b: There are scattered areas of
fibroglandular density.
FINDINGS: There are no findings suspicious for malignancy. Images were
processed with CAD.
IMPRESSION: No mammographic evidence of malignancy. A result letter of this
screening mammogram will be mailed directly to the patient.

RECOMMENDATION:
Screening mammogram in one year. (Code:[id number])

BI-RADS CATEGORY  1: Negative.

## 2021-10-29 DIAGNOSIS — M5124 Other intervertebral disc displacement, thoracic region: Secondary | ICD-10-CM | POA: Diagnosis not present

## 2021-10-29 DIAGNOSIS — M5030 Other cervical disc degeneration, unspecified cervical region: Secondary | ICD-10-CM | POA: Diagnosis not present

## 2021-10-29 DIAGNOSIS — M9902 Segmental and somatic dysfunction of thoracic region: Secondary | ICD-10-CM | POA: Diagnosis not present

## 2021-10-29 DIAGNOSIS — M5137 Other intervertebral disc degeneration, lumbosacral region: Secondary | ICD-10-CM | POA: Diagnosis not present

## 2021-10-29 DIAGNOSIS — M9901 Segmental and somatic dysfunction of cervical region: Secondary | ICD-10-CM | POA: Diagnosis not present

## 2021-10-29 DIAGNOSIS — M9903 Segmental and somatic dysfunction of lumbar region: Secondary | ICD-10-CM | POA: Diagnosis not present

## 2021-11-27 ENCOUNTER — Telehealth: Payer: Self-pay | Admitting: Family Medicine

## 2021-11-27 NOTE — Telephone Encounter (Signed)
I called patient to schedule AWV.  Patient said she's moved to [facility]. Please remove PCP.

## 2021-11-27 NOTE — Telephone Encounter (Signed)
Left message for patient to call back and schedule Medicare Annual Wellness Visit (AWV) in office.   If not able to come in office, please offer to do virtually or by telephone.  Left office number and my jabber [phone].  Last AWV:02/12/2020  Please schedule at anytime with Nurse Health Advisor.

## 2021-12-01 DIAGNOSIS — Z1211 Encounter for screening for malignant neoplasm of colon: Secondary | ICD-10-CM | POA: Diagnosis not present

## 2021-12-01 DIAGNOSIS — K222 Esophageal obstruction: Secondary | ICD-10-CM | POA: Diagnosis not present

## 2021-12-01 DIAGNOSIS — D120 Benign neoplasm of cecum: Secondary | ICD-10-CM | POA: Diagnosis not present

## 2021-12-01 DIAGNOSIS — K5730 Diverticulosis of large intestine without perforation or abscess without bleeding: Secondary | ICD-10-CM | POA: Diagnosis not present

## 2021-12-01 DIAGNOSIS — E039 Hypothyroidism, unspecified: Secondary | ICD-10-CM | POA: Diagnosis not present

## 2021-12-01 DIAGNOSIS — Z880 Allergy status to penicillin: Secondary | ICD-10-CM | POA: Diagnosis not present

## 2021-12-01 DIAGNOSIS — K2289 Other specified disease of esophagus: Secondary | ICD-10-CM | POA: Diagnosis not present

## 2021-12-01 DIAGNOSIS — K2950 Unspecified chronic gastritis without bleeding: Secondary | ICD-10-CM | POA: Diagnosis not present

## 2021-12-01 DIAGNOSIS — R195 Other fecal abnormalities: Secondary | ICD-10-CM | POA: Diagnosis not present

## 2021-12-01 DIAGNOSIS — K2100 Gastro-esophageal reflux disease with esophagitis, without bleeding: Secondary | ICD-10-CM | POA: Diagnosis not present

## 2021-12-01 DIAGNOSIS — C180 Malignant neoplasm of cecum: Secondary | ICD-10-CM | POA: Diagnosis not present

## 2021-12-01 DIAGNOSIS — M1990 Unspecified osteoarthritis, unspecified site: Secondary | ICD-10-CM | POA: Diagnosis not present

## 2021-12-01 DIAGNOSIS — K219 Gastro-esophageal reflux disease without esophagitis: Secondary | ICD-10-CM | POA: Diagnosis not present

## 2021-12-01 DIAGNOSIS — K449 Diaphragmatic hernia without obstruction or gangrene: Secondary | ICD-10-CM | POA: Diagnosis not present

## 2021-12-01 DIAGNOSIS — K2960 Other gastritis without bleeding: Secondary | ICD-10-CM | POA: Diagnosis not present

## 2021-12-01 DIAGNOSIS — F325 Major depressive disorder, single episode, in full remission: Secondary | ICD-10-CM | POA: Diagnosis not present

## 2021-12-12 DIAGNOSIS — C182 Malignant neoplasm of ascending colon: Secondary | ICD-10-CM | POA: Diagnosis not present

## 2021-12-12 DIAGNOSIS — Z1231 Encounter for screening mammogram for malignant neoplasm of breast: Secondary | ICD-10-CM | POA: Diagnosis not present

## 2021-12-16 DIAGNOSIS — M5137 Other intervertebral disc degeneration, lumbosacral region: Secondary | ICD-10-CM | POA: Diagnosis not present

## 2021-12-16 DIAGNOSIS — M9903 Segmental and somatic dysfunction of lumbar region: Secondary | ICD-10-CM | POA: Diagnosis not present

## 2021-12-16 DIAGNOSIS — M9902 Segmental and somatic dysfunction of thoracic region: Secondary | ICD-10-CM | POA: Diagnosis not present

## 2021-12-16 DIAGNOSIS — M5124 Other intervertebral disc displacement, thoracic region: Secondary | ICD-10-CM | POA: Diagnosis not present

## 2021-12-16 DIAGNOSIS — M9901 Segmental and somatic dysfunction of cervical region: Secondary | ICD-10-CM | POA: Diagnosis not present

## 2021-12-16 DIAGNOSIS — M5030 Other cervical disc degeneration, unspecified cervical region: Secondary | ICD-10-CM | POA: Diagnosis not present

## 2021-12-23 DIAGNOSIS — F419 Anxiety disorder, unspecified: Secondary | ICD-10-CM | POA: Diagnosis not present

## 2021-12-23 DIAGNOSIS — C180 Malignant neoplasm of cecum: Secondary | ICD-10-CM | POA: Diagnosis not present

## 2021-12-23 DIAGNOSIS — E039 Hypothyroidism, unspecified: Secondary | ICD-10-CM | POA: Diagnosis not present

## 2021-12-23 DIAGNOSIS — G8918 Other acute postprocedural pain: Secondary | ICD-10-CM | POA: Diagnosis not present

## 2021-12-23 DIAGNOSIS — C772 Secondary and unspecified malignant neoplasm of intra-abdominal lymph nodes: Secondary | ICD-10-CM | POA: Diagnosis not present

## 2021-12-23 DIAGNOSIS — C189 Malignant neoplasm of colon, unspecified: Secondary | ICD-10-CM | POA: Diagnosis not present

## 2021-12-23 DIAGNOSIS — Z96651 Presence of right artificial knee joint: Secondary | ICD-10-CM | POA: Diagnosis not present

## 2021-12-23 DIAGNOSIS — F325 Major depressive disorder, single episode, in full remission: Secondary | ICD-10-CM | POA: Diagnosis not present

## 2021-12-23 DIAGNOSIS — C182 Malignant neoplasm of ascending colon: Secondary | ICD-10-CM | POA: Diagnosis not present

## 2021-12-23 DIAGNOSIS — J302 Other seasonal allergic rhinitis: Secondary | ICD-10-CM | POA: Diagnosis not present

## 2021-12-23 DIAGNOSIS — K219 Gastro-esophageal reflux disease without esophagitis: Secondary | ICD-10-CM | POA: Diagnosis not present

## 2021-12-23 DIAGNOSIS — M1990 Unspecified osteoarthritis, unspecified site: Secondary | ICD-10-CM | POA: Diagnosis not present

## 2021-12-23 DIAGNOSIS — Z9842 Cataract extraction status, left eye: Secondary | ICD-10-CM | POA: Diagnosis not present

## 2022-01-09 DIAGNOSIS — C182 Malignant neoplasm of ascending colon: Secondary | ICD-10-CM | POA: Diagnosis not present

## 2022-01-09 DIAGNOSIS — C772 Secondary and unspecified malignant neoplasm of intra-abdominal lymph nodes: Secondary | ICD-10-CM | POA: Diagnosis not present

## 2022-01-09 DIAGNOSIS — Z7189 Other specified counseling: Secondary | ICD-10-CM | POA: Diagnosis not present

## 2022-01-21 DIAGNOSIS — M5124 Other intervertebral disc displacement, thoracic region: Secondary | ICD-10-CM | POA: Diagnosis not present

## 2022-01-21 DIAGNOSIS — M9903 Segmental and somatic dysfunction of lumbar region: Secondary | ICD-10-CM | POA: Diagnosis not present

## 2022-01-21 DIAGNOSIS — M9902 Segmental and somatic dysfunction of thoracic region: Secondary | ICD-10-CM | POA: Diagnosis not present

## 2022-01-21 DIAGNOSIS — M5030 Other cervical disc degeneration, unspecified cervical region: Secondary | ICD-10-CM | POA: Diagnosis not present

## 2022-01-21 DIAGNOSIS — M9901 Segmental and somatic dysfunction of cervical region: Secondary | ICD-10-CM | POA: Diagnosis not present

## 2022-01-21 DIAGNOSIS — M5137 Other intervertebral disc degeneration, lumbosacral region: Secondary | ICD-10-CM | POA: Diagnosis not present

## 2022-01-30 DIAGNOSIS — M5030 Other cervical disc degeneration, unspecified cervical region: Secondary | ICD-10-CM | POA: Diagnosis not present

## 2022-01-30 DIAGNOSIS — M9903 Segmental and somatic dysfunction of lumbar region: Secondary | ICD-10-CM | POA: Diagnosis not present

## 2022-01-30 DIAGNOSIS — M9902 Segmental and somatic dysfunction of thoracic region: Secondary | ICD-10-CM | POA: Diagnosis not present

## 2022-01-30 DIAGNOSIS — M9901 Segmental and somatic dysfunction of cervical region: Secondary | ICD-10-CM | POA: Diagnosis not present

## 2022-01-30 DIAGNOSIS — M5137 Other intervertebral disc degeneration, lumbosacral region: Secondary | ICD-10-CM | POA: Diagnosis not present

## 2022-01-30 DIAGNOSIS — M5124 Other intervertebral disc displacement, thoracic region: Secondary | ICD-10-CM | POA: Diagnosis not present

## 2022-02-21 DIAGNOSIS — J069 Acute upper respiratory infection, unspecified: Secondary | ICD-10-CM | POA: Diagnosis not present

## 2022-02-21 DIAGNOSIS — R059 Cough, unspecified: Secondary | ICD-10-CM | POA: Diagnosis not present

## 2022-02-24 DIAGNOSIS — C182 Malignant neoplasm of ascending colon: Secondary | ICD-10-CM | POA: Diagnosis not present

## 2022-02-24 DIAGNOSIS — J40 Bronchitis, not specified as acute or chronic: Secondary | ICD-10-CM | POA: Diagnosis not present

## 2022-02-24 DIAGNOSIS — R051 Acute cough: Secondary | ICD-10-CM | POA: Diagnosis not present

## 2022-02-24 DIAGNOSIS — J069 Acute upper respiratory infection, unspecified: Secondary | ICD-10-CM | POA: Diagnosis not present

## 2022-03-02 ENCOUNTER — Other Ambulatory Visit: Payer: Self-pay | Admitting: Family Medicine

## 2022-03-02 DIAGNOSIS — F321 Major depressive disorder, single episode, moderate: Secondary | ICD-10-CM

## 2022-03-02 DIAGNOSIS — C182 Malignant neoplasm of ascending colon: Secondary | ICD-10-CM | POA: Diagnosis not present

## 2022-03-09 DIAGNOSIS — T451X5A Adverse effect of antineoplastic and immunosuppressive drugs, initial encounter: Secondary | ICD-10-CM | POA: Diagnosis not present

## 2022-03-09 DIAGNOSIS — L270 Generalized skin eruption due to drugs and medicaments taken internally: Secondary | ICD-10-CM | POA: Diagnosis not present

## 2022-03-09 DIAGNOSIS — C772 Secondary and unspecified malignant neoplasm of intra-abdominal lymph nodes: Secondary | ICD-10-CM | POA: Diagnosis not present

## 2022-03-09 DIAGNOSIS — C182 Malignant neoplasm of ascending colon: Secondary | ICD-10-CM | POA: Diagnosis not present

## 2022-03-17 DIAGNOSIS — M9902 Segmental and somatic dysfunction of thoracic region: Secondary | ICD-10-CM | POA: Diagnosis not present

## 2022-03-17 DIAGNOSIS — M9901 Segmental and somatic dysfunction of cervical region: Secondary | ICD-10-CM | POA: Diagnosis not present

## 2022-03-17 DIAGNOSIS — M5124 Other intervertebral disc displacement, thoracic region: Secondary | ICD-10-CM | POA: Diagnosis not present

## 2022-03-17 DIAGNOSIS — M5137 Other intervertebral disc degeneration, lumbosacral region: Secondary | ICD-10-CM | POA: Diagnosis not present

## 2022-03-17 DIAGNOSIS — M9903 Segmental and somatic dysfunction of lumbar region: Secondary | ICD-10-CM | POA: Diagnosis not present

## 2022-03-17 DIAGNOSIS — M5030 Other cervical disc degeneration, unspecified cervical region: Secondary | ICD-10-CM | POA: Diagnosis not present

## 2022-04-01 DIAGNOSIS — R600 Localized edema: Secondary | ICD-10-CM | POA: Diagnosis not present

## 2022-04-01 DIAGNOSIS — Z888 Allergy status to other drugs, medicaments and biological substances status: Secondary | ICD-10-CM | POA: Diagnosis not present

## 2022-04-01 DIAGNOSIS — H109 Unspecified conjunctivitis: Secondary | ICD-10-CM | POA: Diagnosis not present

## 2022-04-01 DIAGNOSIS — Z79899 Other long term (current) drug therapy: Secondary | ICD-10-CM | POA: Diagnosis not present

## 2022-04-01 DIAGNOSIS — Z880 Allergy status to penicillin: Secondary | ICD-10-CM | POA: Diagnosis not present

## 2022-04-01 DIAGNOSIS — I517 Cardiomegaly: Secondary | ICD-10-CM | POA: Diagnosis not present

## 2022-04-07 DIAGNOSIS — C182 Malignant neoplasm of ascending colon: Secondary | ICD-10-CM | POA: Diagnosis not present

## 2022-04-13 ENCOUNTER — Other Ambulatory Visit: Payer: Self-pay | Admitting: Family Medicine

## 2022-04-13 ENCOUNTER — Other Ambulatory Visit: Payer: Self-pay

## 2022-04-13 DIAGNOSIS — F321 Major depressive disorder, single episode, moderate: Secondary | ICD-10-CM

## 2022-04-20 DIAGNOSIS — C182 Malignant neoplasm of ascending colon: Secondary | ICD-10-CM | POA: Diagnosis not present

## 2022-04-20 DIAGNOSIS — H1032 Unspecified acute conjunctivitis, left eye: Secondary | ICD-10-CM | POA: Diagnosis not present

## 2022-05-15 DIAGNOSIS — C182 Malignant neoplasm of ascending colon: Secondary | ICD-10-CM | POA: Diagnosis not present

## 2022-05-15 DIAGNOSIS — C772 Secondary and unspecified malignant neoplasm of intra-abdominal lymph nodes: Secondary | ICD-10-CM | POA: Diagnosis not present

## 2022-05-15 DIAGNOSIS — L270 Generalized skin eruption due to drugs and medicaments taken internally: Secondary | ICD-10-CM | POA: Diagnosis not present

## 2022-05-15 DIAGNOSIS — K7689 Other specified diseases of liver: Secondary | ICD-10-CM | POA: Diagnosis not present

## 2022-05-15 DIAGNOSIS — K769 Liver disease, unspecified: Secondary | ICD-10-CM | POA: Diagnosis not present

## 2022-05-15 DIAGNOSIS — R109 Unspecified abdominal pain: Secondary | ICD-10-CM | POA: Diagnosis not present

## 2022-05-30 DIAGNOSIS — C182 Malignant neoplasm of ascending colon: Secondary | ICD-10-CM | POA: Diagnosis not present

## 2022-06-02 DIAGNOSIS — R059 Cough, unspecified: Secondary | ICD-10-CM | POA: Diagnosis not present

## 2022-06-02 DIAGNOSIS — E039 Hypothyroidism, unspecified: Secondary | ICD-10-CM | POA: Diagnosis not present

## 2022-06-02 DIAGNOSIS — R7301 Impaired fasting glucose: Secondary | ICD-10-CM | POA: Diagnosis not present

## 2022-06-02 DIAGNOSIS — E785 Hyperlipidemia, unspecified: Secondary | ICD-10-CM | POA: Diagnosis not present

## 2022-06-02 DIAGNOSIS — C772 Secondary and unspecified malignant neoplasm of intra-abdominal lymph nodes: Secondary | ICD-10-CM | POA: Diagnosis not present

## 2022-06-02 DIAGNOSIS — C182 Malignant neoplasm of ascending colon: Secondary | ICD-10-CM | POA: Diagnosis not present

## 2022-06-02 DIAGNOSIS — F330 Major depressive disorder, recurrent, mild: Secondary | ICD-10-CM | POA: Diagnosis not present

## 2022-06-02 DIAGNOSIS — R0989 Other specified symptoms and signs involving the circulatory and respiratory systems: Secondary | ICD-10-CM | POA: Diagnosis not present

## 2022-06-02 DIAGNOSIS — K219 Gastro-esophageal reflux disease without esophagitis: Secondary | ICD-10-CM | POA: Diagnosis not present

## 2022-06-04 DIAGNOSIS — H43813 Vitreous degeneration, bilateral: Secondary | ICD-10-CM | POA: Diagnosis not present

## 2022-06-04 DIAGNOSIS — H524 Presbyopia: Secondary | ICD-10-CM | POA: Diagnosis not present

## 2022-06-08 DIAGNOSIS — C182 Malignant neoplasm of ascending colon: Secondary | ICD-10-CM | POA: Diagnosis not present

## 2022-06-08 DIAGNOSIS — R062 Wheezing: Secondary | ICD-10-CM | POA: Diagnosis not present

## 2022-06-08 DIAGNOSIS — R051 Acute cough: Secondary | ICD-10-CM | POA: Diagnosis not present

## 2022-06-10 DIAGNOSIS — R0602 Shortness of breath: Secondary | ICD-10-CM | POA: Diagnosis not present

## 2022-06-10 DIAGNOSIS — R600 Localized edema: Secondary | ICD-10-CM | POA: Diagnosis not present

## 2022-06-10 DIAGNOSIS — R5383 Other fatigue: Secondary | ICD-10-CM | POA: Diagnosis not present

## 2022-06-10 DIAGNOSIS — Z880 Allergy status to penicillin: Secondary | ICD-10-CM | POA: Diagnosis not present

## 2022-06-10 DIAGNOSIS — Z8589 Personal history of malignant neoplasm of other organs and systems: Secondary | ICD-10-CM | POA: Diagnosis not present

## 2022-06-10 DIAGNOSIS — R059 Cough, unspecified: Secondary | ICD-10-CM | POA: Diagnosis not present

## 2022-06-10 DIAGNOSIS — Z888 Allergy status to other drugs, medicaments and biological substances status: Secondary | ICD-10-CM | POA: Diagnosis not present

## 2022-06-10 DIAGNOSIS — Z85038 Personal history of other malignant neoplasm of large intestine: Secondary | ICD-10-CM | POA: Diagnosis not present

## 2022-06-10 DIAGNOSIS — Z8616 Personal history of COVID-19: Secondary | ICD-10-CM | POA: Diagnosis not present

## 2022-06-10 DIAGNOSIS — Z9109 Other allergy status, other than to drugs and biological substances: Secondary | ICD-10-CM | POA: Diagnosis not present

## 2022-06-10 DIAGNOSIS — E039 Hypothyroidism, unspecified: Secondary | ICD-10-CM | POA: Diagnosis not present

## 2022-06-10 DIAGNOSIS — F32A Depression, unspecified: Secondary | ICD-10-CM | POA: Diagnosis not present

## 2022-06-30 DIAGNOSIS — H0288B Meibomian gland dysfunction left eye, upper and lower eyelids: Secondary | ICD-10-CM | POA: Diagnosis not present

## 2022-06-30 DIAGNOSIS — C772 Secondary and unspecified malignant neoplasm of intra-abdominal lymph nodes: Secondary | ICD-10-CM | POA: Diagnosis not present

## 2022-06-30 DIAGNOSIS — C182 Malignant neoplasm of ascending colon: Secondary | ICD-10-CM | POA: Diagnosis not present

## 2022-06-30 DIAGNOSIS — H1045 Other chronic allergic conjunctivitis: Secondary | ICD-10-CM | POA: Diagnosis not present

## 2022-06-30 DIAGNOSIS — H04123 Dry eye syndrome of bilateral lacrimal glands: Secondary | ICD-10-CM | POA: Diagnosis not present

## 2022-06-30 DIAGNOSIS — H0288A Meibomian gland dysfunction right eye, upper and lower eyelids: Secondary | ICD-10-CM | POA: Diagnosis not present

## 2022-06-30 DIAGNOSIS — K7689 Other specified diseases of liver: Secondary | ICD-10-CM | POA: Diagnosis not present

## 2022-07-10 DIAGNOSIS — R911 Solitary pulmonary nodule: Secondary | ICD-10-CM | POA: Diagnosis not present

## 2022-07-10 DIAGNOSIS — Z9221 Personal history of antineoplastic chemotherapy: Secondary | ICD-10-CM | POA: Diagnosis not present

## 2022-07-10 DIAGNOSIS — C182 Malignant neoplasm of ascending colon: Secondary | ICD-10-CM | POA: Diagnosis not present

## 2022-07-10 DIAGNOSIS — C772 Secondary and unspecified malignant neoplasm of intra-abdominal lymph nodes: Secondary | ICD-10-CM | POA: Diagnosis not present

## 2022-07-10 DIAGNOSIS — L270 Generalized skin eruption due to drugs and medicaments taken internally: Secondary | ICD-10-CM | POA: Diagnosis not present

## 2022-07-10 DIAGNOSIS — K7689 Other specified diseases of liver: Secondary | ICD-10-CM | POA: Diagnosis not present

## 2022-07-13 DIAGNOSIS — C182 Malignant neoplasm of ascending colon: Secondary | ICD-10-CM | POA: Diagnosis not present

## 2022-07-13 DIAGNOSIS — Q66212 Congenital metatarsus primus varus, left foot: Secondary | ICD-10-CM | POA: Diagnosis not present

## 2022-07-13 DIAGNOSIS — M2012 Hallux valgus (acquired), left foot: Secondary | ICD-10-CM | POA: Diagnosis not present

## 2022-07-20 DIAGNOSIS — I8290 Acute embolism and thrombosis of unspecified vein: Secondary | ICD-10-CM | POA: Diagnosis not present

## 2022-07-20 DIAGNOSIS — I823 Embolism and thrombosis of renal vein: Secondary | ICD-10-CM | POA: Diagnosis not present

## 2022-07-20 DIAGNOSIS — I517 Cardiomegaly: Secondary | ICD-10-CM | POA: Diagnosis not present

## 2022-07-20 DIAGNOSIS — I361 Nonrheumatic tricuspid (valve) insufficiency: Secondary | ICD-10-CM | POA: Diagnosis not present

## 2022-07-20 DIAGNOSIS — R0609 Other forms of dyspnea: Secondary | ICD-10-CM | POA: Diagnosis not present

## 2022-07-20 DIAGNOSIS — Z7901 Long term (current) use of anticoagulants: Secondary | ICD-10-CM | POA: Diagnosis not present

## 2022-07-20 DIAGNOSIS — C182 Malignant neoplasm of ascending colon: Secondary | ICD-10-CM | POA: Diagnosis not present

## 2022-07-20 DIAGNOSIS — I3481 Nonrheumatic mitral (valve) annulus calcification: Secondary | ICD-10-CM | POA: Diagnosis not present

## 2022-07-20 DIAGNOSIS — E782 Mixed hyperlipidemia: Secondary | ICD-10-CM | POA: Diagnosis not present

## 2022-08-17 DIAGNOSIS — Z7901 Long term (current) use of anticoagulants: Secondary | ICD-10-CM | POA: Diagnosis not present

## 2022-08-17 DIAGNOSIS — Z7689 Persons encountering health services in other specified circumstances: Secondary | ICD-10-CM | POA: Diagnosis not present

## 2022-08-17 DIAGNOSIS — Z79899 Other long term (current) drug therapy: Secondary | ICD-10-CM | POA: Diagnosis not present

## 2022-08-17 DIAGNOSIS — E039 Hypothyroidism, unspecified: Secondary | ICD-10-CM | POA: Diagnosis not present

## 2022-08-24 DIAGNOSIS — C182 Malignant neoplasm of ascending colon: Secondary | ICD-10-CM | POA: Diagnosis not present

## 2022-08-25 DIAGNOSIS — E039 Hypothyroidism, unspecified: Secondary | ICD-10-CM | POA: Diagnosis not present

## 2022-08-25 DIAGNOSIS — E8881 Metabolic syndrome: Secondary | ICD-10-CM | POA: Diagnosis not present

## 2022-08-25 DIAGNOSIS — Z79899 Other long term (current) drug therapy: Secondary | ICD-10-CM | POA: Diagnosis not present

## 2022-08-25 DIAGNOSIS — E782 Mixed hyperlipidemia: Secondary | ICD-10-CM | POA: Diagnosis not present

## 2022-08-25 DIAGNOSIS — E559 Vitamin D deficiency, unspecified: Secondary | ICD-10-CM | POA: Diagnosis not present

## 2022-08-31 DIAGNOSIS — Z6830 Body mass index (BMI) 30.0-30.9, adult: Secondary | ICD-10-CM | POA: Diagnosis not present

## 2022-08-31 DIAGNOSIS — D649 Anemia, unspecified: Secondary | ICD-10-CM | POA: Diagnosis not present

## 2022-08-31 DIAGNOSIS — E039 Hypothyroidism, unspecified: Secondary | ICD-10-CM | POA: Diagnosis not present

## 2022-08-31 DIAGNOSIS — E669 Obesity, unspecified: Secondary | ICD-10-CM | POA: Diagnosis not present

## 2022-09-21 DIAGNOSIS — C182 Malignant neoplasm of ascending colon: Secondary | ICD-10-CM | POA: Diagnosis not present

## 2022-09-21 DIAGNOSIS — D649 Anemia, unspecified: Secondary | ICD-10-CM | POA: Diagnosis not present

## 2022-09-21 DIAGNOSIS — Z6830 Body mass index (BMI) 30.0-30.9, adult: Secondary | ICD-10-CM | POA: Diagnosis not present

## 2022-09-21 DIAGNOSIS — E663 Overweight: Secondary | ICD-10-CM | POA: Diagnosis not present

## 2022-09-21 DIAGNOSIS — E669 Obesity, unspecified: Secondary | ICD-10-CM | POA: Diagnosis not present

## 2022-09-21 DIAGNOSIS — E782 Mixed hyperlipidemia: Secondary | ICD-10-CM | POA: Diagnosis not present

## 2022-09-21 DIAGNOSIS — K219 Gastro-esophageal reflux disease without esophagitis: Secondary | ICD-10-CM | POA: Diagnosis not present

## 2022-09-21 DIAGNOSIS — R609 Edema, unspecified: Secondary | ICD-10-CM | POA: Diagnosis not present

## 2022-09-21 DIAGNOSIS — I8290 Acute embolism and thrombosis of unspecified vein: Secondary | ICD-10-CM | POA: Diagnosis not present

## 2022-09-21 DIAGNOSIS — Z6829 Body mass index (BMI) 29.0-29.9, adult: Secondary | ICD-10-CM | POA: Diagnosis not present

## 2022-09-21 DIAGNOSIS — R0609 Other forms of dyspnea: Secondary | ICD-10-CM | POA: Diagnosis not present

## 2022-10-05 DIAGNOSIS — Z6829 Body mass index (BMI) 29.0-29.9, adult: Secondary | ICD-10-CM | POA: Diagnosis not present

## 2022-10-05 DIAGNOSIS — E039 Hypothyroidism, unspecified: Secondary | ICD-10-CM | POA: Diagnosis not present

## 2022-10-05 DIAGNOSIS — R911 Solitary pulmonary nodule: Secondary | ICD-10-CM | POA: Diagnosis not present

## 2022-10-05 DIAGNOSIS — I82220 Acute embolism and thrombosis of inferior vena cava: Secondary | ICD-10-CM | POA: Diagnosis not present

## 2022-10-05 DIAGNOSIS — C182 Malignant neoplasm of ascending colon: Secondary | ICD-10-CM | POA: Diagnosis not present

## 2022-10-05 DIAGNOSIS — K7689 Other specified diseases of liver: Secondary | ICD-10-CM | POA: Diagnosis not present

## 2022-10-05 DIAGNOSIS — I8290 Acute embolism and thrombosis of unspecified vein: Secondary | ICD-10-CM | POA: Diagnosis not present

## 2022-10-05 DIAGNOSIS — L270 Generalized skin eruption due to drugs and medicaments taken internally: Secondary | ICD-10-CM | POA: Diagnosis not present

## 2022-10-05 DIAGNOSIS — C772 Secondary and unspecified malignant neoplasm of intra-abdominal lymph nodes: Secondary | ICD-10-CM | POA: Diagnosis not present

## 2022-10-05 DIAGNOSIS — E663 Overweight: Secondary | ICD-10-CM | POA: Diagnosis not present

## 2022-10-23 DIAGNOSIS — J029 Acute pharyngitis, unspecified: Secondary | ICD-10-CM | POA: Diagnosis not present

## 2022-10-23 DIAGNOSIS — C182 Malignant neoplasm of ascending colon: Secondary | ICD-10-CM | POA: Diagnosis not present

## 2022-10-23 DIAGNOSIS — I82220 Acute embolism and thrombosis of inferior vena cava: Secondary | ICD-10-CM | POA: Diagnosis not present

## 2022-10-23 DIAGNOSIS — U071 COVID-19: Secondary | ICD-10-CM | POA: Diagnosis not present

## 2022-10-23 DIAGNOSIS — R059 Cough, unspecified: Secondary | ICD-10-CM | POA: Diagnosis not present

## 2022-10-23 DIAGNOSIS — R5383 Other fatigue: Secondary | ICD-10-CM | POA: Diagnosis not present

## 2022-10-23 DIAGNOSIS — M21612 Bunion of left foot: Secondary | ICD-10-CM | POA: Diagnosis not present

## 2022-10-23 DIAGNOSIS — Z01818 Encounter for other preprocedural examination: Secondary | ICD-10-CM | POA: Diagnosis not present

## 2022-11-02 DIAGNOSIS — K7689 Other specified diseases of liver: Secondary | ICD-10-CM | POA: Diagnosis not present

## 2022-11-02 DIAGNOSIS — C182 Malignant neoplasm of ascending colon: Secondary | ICD-10-CM | POA: Diagnosis not present

## 2022-11-02 DIAGNOSIS — I82220 Acute embolism and thrombosis of inferior vena cava: Secondary | ICD-10-CM | POA: Diagnosis not present

## 2022-11-09 DIAGNOSIS — M546 Pain in thoracic spine: Secondary | ICD-10-CM | POA: Diagnosis not present

## 2022-11-09 DIAGNOSIS — M62838 Other muscle spasm: Secondary | ICD-10-CM | POA: Diagnosis not present

## 2022-11-09 DIAGNOSIS — G43009 Migraine without aura, not intractable, without status migrainosus: Secondary | ICD-10-CM | POA: Diagnosis not present

## 2022-11-09 DIAGNOSIS — M9902 Segmental and somatic dysfunction of thoracic region: Secondary | ICD-10-CM | POA: Diagnosis not present

## 2022-11-09 DIAGNOSIS — M9901 Segmental and somatic dysfunction of cervical region: Secondary | ICD-10-CM | POA: Diagnosis not present

## 2022-11-09 DIAGNOSIS — M9903 Segmental and somatic dysfunction of lumbar region: Secondary | ICD-10-CM | POA: Diagnosis not present

## 2022-11-09 DIAGNOSIS — M5136 Other intervertebral disc degeneration, lumbar region: Secondary | ICD-10-CM | POA: Diagnosis not present

## 2022-11-09 DIAGNOSIS — M50322 Other cervical disc degeneration at C5-C6 level: Secondary | ICD-10-CM | POA: Diagnosis not present

## 2022-11-12 DIAGNOSIS — M5136 Other intervertebral disc degeneration, lumbar region: Secondary | ICD-10-CM | POA: Diagnosis not present

## 2022-11-12 DIAGNOSIS — M9903 Segmental and somatic dysfunction of lumbar region: Secondary | ICD-10-CM | POA: Diagnosis not present

## 2022-11-12 DIAGNOSIS — G43009 Migraine without aura, not intractable, without status migrainosus: Secondary | ICD-10-CM | POA: Diagnosis not present

## 2022-11-12 DIAGNOSIS — M9901 Segmental and somatic dysfunction of cervical region: Secondary | ICD-10-CM | POA: Diagnosis not present

## 2022-11-12 DIAGNOSIS — M50322 Other cervical disc degeneration at C5-C6 level: Secondary | ICD-10-CM | POA: Diagnosis not present

## 2022-11-12 DIAGNOSIS — M9902 Segmental and somatic dysfunction of thoracic region: Secondary | ICD-10-CM | POA: Diagnosis not present

## 2022-11-12 DIAGNOSIS — M62838 Other muscle spasm: Secondary | ICD-10-CM | POA: Diagnosis not present

## 2022-11-12 DIAGNOSIS — M546 Pain in thoracic spine: Secondary | ICD-10-CM | POA: Diagnosis not present

## 2022-11-16 DIAGNOSIS — M50322 Other cervical disc degeneration at C5-C6 level: Secondary | ICD-10-CM | POA: Diagnosis not present

## 2022-11-16 DIAGNOSIS — G43009 Migraine without aura, not intractable, without status migrainosus: Secondary | ICD-10-CM | POA: Diagnosis not present

## 2022-11-16 DIAGNOSIS — M9901 Segmental and somatic dysfunction of cervical region: Secondary | ICD-10-CM | POA: Diagnosis not present

## 2022-11-16 DIAGNOSIS — M9902 Segmental and somatic dysfunction of thoracic region: Secondary | ICD-10-CM | POA: Diagnosis not present

## 2022-11-16 DIAGNOSIS — M9903 Segmental and somatic dysfunction of lumbar region: Secondary | ICD-10-CM | POA: Diagnosis not present

## 2022-11-16 DIAGNOSIS — M546 Pain in thoracic spine: Secondary | ICD-10-CM | POA: Diagnosis not present

## 2022-11-16 DIAGNOSIS — M62838 Other muscle spasm: Secondary | ICD-10-CM | POA: Diagnosis not present

## 2022-11-16 DIAGNOSIS — M5136 Other intervertebral disc degeneration, lumbar region: Secondary | ICD-10-CM | POA: Diagnosis not present

## 2022-11-18 DIAGNOSIS — M50322 Other cervical disc degeneration at C5-C6 level: Secondary | ICD-10-CM | POA: Diagnosis not present

## 2022-11-18 DIAGNOSIS — M9901 Segmental and somatic dysfunction of cervical region: Secondary | ICD-10-CM | POA: Diagnosis not present

## 2022-11-18 DIAGNOSIS — G43009 Migraine without aura, not intractable, without status migrainosus: Secondary | ICD-10-CM | POA: Diagnosis not present

## 2022-11-18 DIAGNOSIS — M546 Pain in thoracic spine: Secondary | ICD-10-CM | POA: Diagnosis not present

## 2022-11-18 DIAGNOSIS — M9903 Segmental and somatic dysfunction of lumbar region: Secondary | ICD-10-CM | POA: Diagnosis not present

## 2022-11-18 DIAGNOSIS — M9902 Segmental and somatic dysfunction of thoracic region: Secondary | ICD-10-CM | POA: Diagnosis not present

## 2022-11-18 DIAGNOSIS — M5136 Other intervertebral disc degeneration, lumbar region: Secondary | ICD-10-CM | POA: Diagnosis not present

## 2022-11-18 DIAGNOSIS — M62838 Other muscle spasm: Secondary | ICD-10-CM | POA: Diagnosis not present

## 2022-11-23 DIAGNOSIS — M546 Pain in thoracic spine: Secondary | ICD-10-CM | POA: Diagnosis not present

## 2022-11-23 DIAGNOSIS — M5136 Other intervertebral disc degeneration, lumbar region: Secondary | ICD-10-CM | POA: Diagnosis not present

## 2022-11-23 DIAGNOSIS — M9902 Segmental and somatic dysfunction of thoracic region: Secondary | ICD-10-CM | POA: Diagnosis not present

## 2022-11-23 DIAGNOSIS — M9901 Segmental and somatic dysfunction of cervical region: Secondary | ICD-10-CM | POA: Diagnosis not present

## 2022-11-23 DIAGNOSIS — M9903 Segmental and somatic dysfunction of lumbar region: Secondary | ICD-10-CM | POA: Diagnosis not present

## 2022-11-23 DIAGNOSIS — M50322 Other cervical disc degeneration at C5-C6 level: Secondary | ICD-10-CM | POA: Diagnosis not present

## 2022-11-23 DIAGNOSIS — G43009 Migraine without aura, not intractable, without status migrainosus: Secondary | ICD-10-CM | POA: Diagnosis not present

## 2022-11-23 DIAGNOSIS — M62838 Other muscle spasm: Secondary | ICD-10-CM | POA: Diagnosis not present

## 2022-11-27 DIAGNOSIS — G43009 Migraine without aura, not intractable, without status migrainosus: Secondary | ICD-10-CM | POA: Diagnosis not present

## 2022-11-27 DIAGNOSIS — M9901 Segmental and somatic dysfunction of cervical region: Secondary | ICD-10-CM | POA: Diagnosis not present

## 2022-11-27 DIAGNOSIS — M5136 Other intervertebral disc degeneration, lumbar region: Secondary | ICD-10-CM | POA: Diagnosis not present

## 2022-11-27 DIAGNOSIS — M9903 Segmental and somatic dysfunction of lumbar region: Secondary | ICD-10-CM | POA: Diagnosis not present

## 2022-11-27 DIAGNOSIS — M9902 Segmental and somatic dysfunction of thoracic region: Secondary | ICD-10-CM | POA: Diagnosis not present

## 2022-11-27 DIAGNOSIS — M62838 Other muscle spasm: Secondary | ICD-10-CM | POA: Diagnosis not present

## 2022-11-27 DIAGNOSIS — M546 Pain in thoracic spine: Secondary | ICD-10-CM | POA: Diagnosis not present

## 2022-11-27 DIAGNOSIS — M50322 Other cervical disc degeneration at C5-C6 level: Secondary | ICD-10-CM | POA: Diagnosis not present

## 2022-12-02 DIAGNOSIS — M9903 Segmental and somatic dysfunction of lumbar region: Secondary | ICD-10-CM | POA: Diagnosis not present

## 2022-12-02 DIAGNOSIS — M9902 Segmental and somatic dysfunction of thoracic region: Secondary | ICD-10-CM | POA: Diagnosis not present

## 2022-12-02 DIAGNOSIS — M546 Pain in thoracic spine: Secondary | ICD-10-CM | POA: Diagnosis not present

## 2022-12-02 DIAGNOSIS — M9901 Segmental and somatic dysfunction of cervical region: Secondary | ICD-10-CM | POA: Diagnosis not present

## 2022-12-02 DIAGNOSIS — M50322 Other cervical disc degeneration at C5-C6 level: Secondary | ICD-10-CM | POA: Diagnosis not present

## 2022-12-02 DIAGNOSIS — G43009 Migraine without aura, not intractable, without status migrainosus: Secondary | ICD-10-CM | POA: Diagnosis not present

## 2022-12-02 DIAGNOSIS — M62838 Other muscle spasm: Secondary | ICD-10-CM | POA: Diagnosis not present

## 2022-12-02 DIAGNOSIS — M5136 Other intervertebral disc degeneration, lumbar region: Secondary | ICD-10-CM | POA: Diagnosis not present

## 2022-12-07 DIAGNOSIS — K219 Gastro-esophageal reflux disease without esophagitis: Secondary | ICD-10-CM | POA: Diagnosis not present

## 2022-12-07 DIAGNOSIS — D84821 Immunodeficiency due to drugs: Secondary | ICD-10-CM | POA: Diagnosis not present

## 2022-12-07 DIAGNOSIS — C182 Malignant neoplasm of ascending colon: Secondary | ICD-10-CM | POA: Diagnosis not present

## 2022-12-07 DIAGNOSIS — Z79899 Other long term (current) drug therapy: Secondary | ICD-10-CM | POA: Diagnosis not present

## 2022-12-07 DIAGNOSIS — F330 Major depressive disorder, recurrent, mild: Secondary | ICD-10-CM | POA: Diagnosis not present

## 2022-12-07 DIAGNOSIS — C772 Secondary and unspecified malignant neoplasm of intra-abdominal lymph nodes: Secondary | ICD-10-CM | POA: Diagnosis not present

## 2022-12-07 DIAGNOSIS — R059 Cough, unspecified: Secondary | ICD-10-CM | POA: Diagnosis not present

## 2022-12-07 DIAGNOSIS — E785 Hyperlipidemia, unspecified: Secondary | ICD-10-CM | POA: Diagnosis not present

## 2022-12-09 DIAGNOSIS — C182 Malignant neoplasm of ascending colon: Secondary | ICD-10-CM | POA: Diagnosis not present

## 2022-12-30 DIAGNOSIS — M9901 Segmental and somatic dysfunction of cervical region: Secondary | ICD-10-CM | POA: Diagnosis not present

## 2022-12-30 DIAGNOSIS — M5136 Other intervertebral disc degeneration, lumbar region: Secondary | ICD-10-CM | POA: Diagnosis not present

## 2022-12-30 DIAGNOSIS — M9903 Segmental and somatic dysfunction of lumbar region: Secondary | ICD-10-CM | POA: Diagnosis not present

## 2022-12-30 DIAGNOSIS — M546 Pain in thoracic spine: Secondary | ICD-10-CM | POA: Diagnosis not present

## 2022-12-30 DIAGNOSIS — M50322 Other cervical disc degeneration at C5-C6 level: Secondary | ICD-10-CM | POA: Diagnosis not present

## 2022-12-30 DIAGNOSIS — M9902 Segmental and somatic dysfunction of thoracic region: Secondary | ICD-10-CM | POA: Diagnosis not present

## 2022-12-30 DIAGNOSIS — M62838 Other muscle spasm: Secondary | ICD-10-CM | POA: Diagnosis not present

## 2022-12-30 DIAGNOSIS — G43009 Migraine without aura, not intractable, without status migrainosus: Secondary | ICD-10-CM | POA: Diagnosis not present

## 2023-01-04 DIAGNOSIS — Z7901 Long term (current) use of anticoagulants: Secondary | ICD-10-CM | POA: Diagnosis not present

## 2023-01-04 DIAGNOSIS — K2100 Gastro-esophageal reflux disease with esophagitis, without bleeding: Secondary | ICD-10-CM | POA: Diagnosis not present

## 2023-01-04 DIAGNOSIS — C182 Malignant neoplasm of ascending colon: Secondary | ICD-10-CM | POA: Diagnosis not present

## 2023-01-04 DIAGNOSIS — N280 Ischemia and infarction of kidney: Secondary | ICD-10-CM | POA: Diagnosis not present

## 2023-01-04 DIAGNOSIS — Z85038 Personal history of other malignant neoplasm of large intestine: Secondary | ICD-10-CM | POA: Diagnosis not present

## 2023-01-08 DIAGNOSIS — C182 Malignant neoplasm of ascending colon: Secondary | ICD-10-CM | POA: Diagnosis not present

## 2023-01-08 DIAGNOSIS — I82220 Acute embolism and thrombosis of inferior vena cava: Secondary | ICD-10-CM | POA: Diagnosis not present

## 2023-01-08 DIAGNOSIS — E611 Iron deficiency: Secondary | ICD-10-CM | POA: Diagnosis not present

## 2023-01-08 DIAGNOSIS — K7689 Other specified diseases of liver: Secondary | ICD-10-CM | POA: Diagnosis not present

## 2023-01-08 DIAGNOSIS — R911 Solitary pulmonary nodule: Secondary | ICD-10-CM | POA: Diagnosis not present

## 2023-01-08 DIAGNOSIS — C772 Secondary and unspecified malignant neoplasm of intra-abdominal lymph nodes: Secondary | ICD-10-CM | POA: Diagnosis not present

## 2023-01-11 DIAGNOSIS — M50322 Other cervical disc degeneration at C5-C6 level: Secondary | ICD-10-CM | POA: Diagnosis not present

## 2023-01-11 DIAGNOSIS — G43009 Migraine without aura, not intractable, without status migrainosus: Secondary | ICD-10-CM | POA: Diagnosis not present

## 2023-01-11 DIAGNOSIS — M9902 Segmental and somatic dysfunction of thoracic region: Secondary | ICD-10-CM | POA: Diagnosis not present

## 2023-01-11 DIAGNOSIS — M546 Pain in thoracic spine: Secondary | ICD-10-CM | POA: Diagnosis not present

## 2023-01-11 DIAGNOSIS — M62838 Other muscle spasm: Secondary | ICD-10-CM | POA: Diagnosis not present

## 2023-01-11 DIAGNOSIS — M9901 Segmental and somatic dysfunction of cervical region: Secondary | ICD-10-CM | POA: Diagnosis not present

## 2023-01-11 DIAGNOSIS — M9903 Segmental and somatic dysfunction of lumbar region: Secondary | ICD-10-CM | POA: Diagnosis not present

## 2023-01-11 DIAGNOSIS — M5136 Other intervertebral disc degeneration, lumbar region: Secondary | ICD-10-CM | POA: Diagnosis not present
